# Patient Record
Sex: Female | Born: 1990 | Race: Black or African American | Hispanic: No | Marital: Single | Smoking: Never smoker
Health system: Southern US, Community
[De-identification: ages and names within clinical notes are randomized; demographics above are authoritative.]

## PROBLEM LIST (undated history)

## (undated) ENCOUNTER — Emergency Department (HOSPITAL_COMMUNITY): Admission: EM | Payer: Medicaid Other

## (undated) DIAGNOSIS — Z789 Other specified health status: Secondary | ICD-10-CM

## (undated) HISTORY — DX: Other specified health status: Z78.9

## (undated) HISTORY — PX: NO PAST SURGERIES: SHX2092

---

## 2017-07-27 ENCOUNTER — Encounter (HOSPITAL_COMMUNITY): Payer: Self-pay | Admitting: Emergency Medicine

## 2017-07-27 ENCOUNTER — Ambulatory Visit (HOSPITAL_COMMUNITY)
Admission: EM | Admit: 2017-07-27 | Discharge: 2017-07-27 | Disposition: A | Discharge status: Home / Self Care | Payer: Self-pay | Attending: Family Medicine | Admitting: Family Medicine

## 2017-07-27 DIAGNOSIS — M5489 Other dorsalgia: Secondary | ICD-10-CM

## 2017-07-27 DIAGNOSIS — M7918 Myalgia, other site: Secondary | ICD-10-CM

## 2017-07-27 DIAGNOSIS — M791 Myalgia: Secondary | ICD-10-CM

## 2017-07-27 MED ORDER — METHOCARBAMOL 500 MG PO TABS: 500.0000 mg | ORAL_TABLET | Freq: Four times a day (QID) | ORAL | 0 refills | Status: DC | PRN

## 2017-07-27 MED ORDER — DICLOFENAC SODIUM 75 MG PO TBEC: 75.0000 mg | DELAYED_RELEASE_TABLET | Freq: Two times a day (BID) | ORAL | 0 refills | Status: DC

## 2017-07-27 NOTE — Discharge Instructions (Signed)
You most likely have a strained muscle in your lower back. I have prescribed two medicines for your pain. The first is diclofenac, take 1 tablet twice a day and the other is Robaxin, take 1 tablet two to 4 times a day. Robxin may cause drowsiness so do not drive until you know how this medicine affects you. Also do not drink any alcohol either. You may apply ice and alternate with heat for 15 minutes at a time 4 times daily and for additional pain control you may take tylenol over the counter ever 4 hours but do not take more than 4000 mg a day. Should your pain continue or fail to resolve, follow up with your primary care provider or return to clinic as needed.
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
## 2018-10-31 ENCOUNTER — Emergency Department (HOSPITAL_BASED_OUTPATIENT_CLINIC_OR_DEPARTMENT_OTHER)
Admission: EM | Admit: 2018-10-31 | Discharge: 2018-10-31 | Disposition: A | Discharge status: Home / Self Care | Payer: Medicaid Other | Attending: Emergency Medicine | Admitting: Emergency Medicine

## 2018-10-31 ENCOUNTER — Encounter (HOSPITAL_BASED_OUTPATIENT_CLINIC_OR_DEPARTMENT_OTHER): Payer: Self-pay | Admitting: *Deleted

## 2018-10-31 ENCOUNTER — Other Ambulatory Visit: Payer: Self-pay

## 2018-10-31 DIAGNOSIS — Z79899 Other long term (current) drug therapy: Secondary | ICD-10-CM | POA: Insufficient documentation

## 2018-10-31 DIAGNOSIS — Z3A00 Weeks of gestation of pregnancy not specified: Secondary | ICD-10-CM | POA: Insufficient documentation

## 2018-10-31 DIAGNOSIS — O209 Hemorrhage in early pregnancy, unspecified: Secondary | ICD-10-CM | POA: Insufficient documentation

## 2018-10-31 LAB — COMPREHENSIVE METABOLIC PANEL
ALT: 16 U/L (ref 0–44)
ANION GAP: 5 (ref 5–15)
AST: 23 U/L (ref 15–41)
Albumin: 3.9 g/dL (ref 3.5–5.0)
Alkaline Phosphatase: 38 U/L (ref 38–126)
BUN: 12 mg/dL (ref 6–20)
CHLORIDE: 108 mmol/L (ref 98–111)
CO2: 25 mmol/L (ref 22–32)
Calcium: 8.9 mg/dL (ref 8.9–10.3)
Creatinine, Ser: 0.82 mg/dL (ref 0.44–1.00)
Glucose, Bld: 106 mg/dL — ABNORMAL HIGH (ref 70–99)
POTASSIUM: 3.3 mmol/L — AB (ref 3.5–5.1)
Sodium: 138 mmol/L (ref 135–145)
Total Bilirubin: 1 mg/dL (ref 0.3–1.2)
Total Protein: 6.8 g/dL (ref 6.5–8.1)

## 2018-10-31 LAB — URINALYSIS, MICROSCOPIC (REFLEX): RBC / HPF: >50 RBC/hpf (ref 0–5)

## 2018-10-31 LAB — CBC WITH DIFFERENTIAL/PLATELET
Abs Immature Granulocytes: 0.01 10*3/uL (ref 0.00–0.07)
BASOS PCT: 1 %
Basophils Absolute: 0 10*3/uL (ref 0.0–0.1)
EOS ABS: 0.3 10*3/uL (ref 0.0–0.5)
Eosinophils Relative: 5 %
HCT: 36.7 % (ref 36.0–46.0)
Hemoglobin: 12.1 g/dL (ref 12.0–15.0)
IMMATURE GRANULOCYTES: 0 %
Lymphocytes Relative: 32 %
Lymphs Abs: 2 10*3/uL (ref 0.7–4.0)
MCH: 30.7 pg (ref 26.0–34.0)
MCHC: 33 g/dL (ref 30.0–36.0)
MCV: 93.1 fL (ref 80.0–100.0)
MONOS PCT: 8 %
Monocytes Absolute: 0.5 10*3/uL (ref 0.1–1.0)
Neutro Abs: 3.4 10*3/uL (ref 1.7–7.7)
Neutrophils Relative %: 54 %
PLATELETS: 223 10*3/uL (ref 150–400)
RBC: 3.94 MIL/uL (ref 3.87–5.11)
RDW: 12.9 % (ref 11.5–15.5)
WBC: 6.3 10*3/uL (ref 4.0–10.5)
nRBC: 0 % (ref 0.0–0.2)

## 2018-10-31 LAB — HCG, QUANTITATIVE, PREGNANCY: HCG, BETA CHAIN, QUANT, S: 120 m[IU]/mL — AB (ref <5)

## 2018-10-31 LAB — URINALYSIS, ROUTINE W REFLEX MICROSCOPIC
BILIRUBIN URINE: NEGATIVE
Glucose, UA: NEGATIVE mg/dL
Ketones, ur: NEGATIVE mg/dL
Leukocytes, UA: NEGATIVE
Nitrite: NEGATIVE
PROTEIN: NEGATIVE mg/dL
Specific Gravity, Urine: 1.015 (ref 1.005–1.030)
pH: 7.5 (ref 5.0–8.0)

## 2018-10-31 LAB — RH IG WORKUP (INCLUDES ABO/RH)
ABO/RH(D): O POS
GESTATIONAL AGE(WKS): 5

## 2018-10-31 NOTE — ED Notes (Signed)
ED Provider at bedside. 

## 2018-10-31 NOTE — ED Triage Notes (Signed)
Pt reports she is [redacted] weeks pregnant. Today she has been having vaginal bleeding, spotting this am but now heavier. She is wearing a pad at this time but has not had to change it yet. Pt states this is her first pregnancy

## 2018-10-31 NOTE — Discharge Instructions (Signed)
We will call you regarding your urine and blood type results and may send you a prescription or request you should return.

## 2018-10-31 NOTE — ED Provider Notes (Addendum)
[facility] EMERGENCY DEPARTMENT Provider Note   CSN: [id number] Arrival date & time: 10/31/18  1708     History   Chief Complaint Chief Complaint  Patient presents with  . Vaginal Bleeding    HPI Kayla Potts is a 27 y.o. female.  HPI    Spotting this AM, 9AM-3PM, soaked pantiliner with light blood, no clots or POC Put regular pad around 3, has not had leaking beyond that Does not know blood type LMP was 11/3, had positive pregnancy test at [facility] and at home [redacted]wk pregnant by LMP First pregnancy Had mild cramping, pulling Pain was 5/10, will coming and going No lightheadedness or syncope    History reviewed. No pertinent past medical history.  There are no active problems to display for this patient.   History reviewed. No pertinent surgical history.   OB History    Gravida  1   Para      Term      Preterm      AB      Living        SAB      TAB      Ectopic      Multiple      Live Births               Home Medications    Prior to Admission medications   Medication Sig Start Date End Date Taking? Authorizing Provider  Prenatal Vit-Fe Fumarate-FA (PRENATAL MULTIVITAMIN) TABS tablet Take 1 tablet by mouth daily at 12 noon.   Yes [provider]  methocarbamol (ROBAXIN) 500 MG tablet Take 1 tablet (500 mg total) by mouth every 6 (six) hours as needed for muscle spasms. 07/27/17   Dorena BodoKennard, Lawrence, NP    Family History No family history on file.  Social History Social History   Tobacco Use  . Smoking status: Never Smoker  . Smokeless tobacco: Never Used  Substance Use Topics  . Alcohol use: Not Currently  . Drug use: No     Allergies   Patient has no known allergies.   Review of Systems Review of Systems  Constitutional: Negative for fever.  HENT: Negative for sore throat.   Eyes: Negative for visual disturbance.  Respiratory: Negative for cough and shortness of breath.     Cardiovascular: Negative for chest pain.  Gastrointestinal: Positive for abdominal pain and nausea.  Genitourinary: Positive for vaginal bleeding. Negative for difficulty urinating.  Musculoskeletal: Negative for back pain and neck pain.  Skin: Negative for rash.  Neurological: Negative for syncope and headaches.     Physical Exam Updated Vital Signs BP 129/77 (BP Location: Left Arm)   Pulse 87   Temp 98.5 F (36.9 C) (Oral)   Resp 18   Ht 5' 4.5" (1.638 m)   Wt 77.6 kg   LMP 09/19/2018   SpO2 99%   BMI 28.90 kg/m   Physical Exam Vitals signs and nursing note reviewed.  Constitutional:      General: She is not in acute distress.    Appearance: She is well-developed. She is not diaphoretic.  HENT:     Head: Normocephalic and atraumatic.  Eyes:     Conjunctiva/sclera: Conjunctivae normal.  Neck:     Musculoskeletal: Normal range of motion.  Cardiovascular:     Rate and Rhythm: Normal rate and regular rhythm.  Pulmonary:     Effort: Pulmonary effort is normal. No respiratory distress.  Abdominal:     General: There is  no distension.     Palpations: Abdomen is soft.     Tenderness: There is no abdominal tenderness. There is no guarding.  Musculoskeletal:        General: No tenderness.  Skin:    General: Skin is warm and dry.     Findings: No erythema or rash.  Neurological:     Mental Status: She is alert and oriented to person, place, and time.      ED Treatments / Results  Labs (all labs ordered are listed, but only abnormal results are displayed) Labs Reviewed  COMPREHENSIVE METABOLIC PANEL - Abnormal; Notable for the following components:      Result Value   Potassium 3.3 (*)    Glucose, Bld 106 (*)    All other components within normal limits  HCG, QUANTITATIVE, PREGNANCY - Abnormal; Notable for the following components:   hCG, Beta Chain, Quant, S 120 (*)    All other components within normal limits  URINE CULTURE  CBC WITH DIFFERENTIAL/PLATELET   URINALYSIS, ROUTINE W REFLEX MICROSCOPIC  RH IG WORKUP (INCLUDES ABO/RH)    EKG None  Radiology No results found.  Procedures Procedures (including critical care time)  Medications Ordered in ED Medications - No data to display   Initial Impression / Assessment and Plan / ED Course  I have reviewed the triage vital signs and the nursing notes.  Pertinent labs & imaging results that were available during my care of the patient were reviewed by me and considered in my medical decision making (see chart for details).     27 year old female G1 at 5 weeks by LMP presents with concern for vaginal bleeding and mild lower abdominal cramping.  History and physical exam is not consistent with appendicitis, diverticulitis.  hCG is 120.  She describes overall minimal bleeding. Discussed possibilities of ectopic pregnancy, threatened miscarriage or normal pregnancy with bleeding.  [place] not available today, given low hcg levels doubt ability to see on bedside. Discussed with Dr. Willodean Rosenthal who recommends [facility] [place] tomorrow AM if she is stable.  At this time I have low suspicion for ruptured ectopic pregnancy but have discussed returned precautions with her in detail.  She does not have vaginal discharge, does not describe significant hemorrhage, do not feel emergent pelvic necessary and the patient is desiring discharge.  ABO Rh sent, and urinalysis sent, will follow up on results this evening and notify patient if abnormalities as patient would like immediate discharge and does not show signs of significant hemorrhage or infection.   --Urinalysis without wbc, culture in process, bacteria likely contaminant/mucus.  Pt Rh positive.   Patient to return for [place] tomorrow and follow up with OBGYN at [facility].    Final Clinical Impressions(s) / ED Diagnoses   Final diagnoses:  Vaginal bleeding in pregnancy, first trimester    ED Discharge Orders    None       Alvira Monday,  MD 10/31/18 Kayla Potts    Alvira Monday, MD 11/01/18 [phone]

## 2018-10-31 NOTE — ED Notes (Signed)
Pt given follow up information for outpatient ultrasound. Informed to arrive early with a full bladder. Pt instructed to return to ED or MAU if symptoms worsen throughout the night. Pt voiced understanding. Pt appeared to be in NAD. Pt VSS.

## 2018-11-01 ENCOUNTER — Ambulatory Visit (HOSPITAL_BASED_OUTPATIENT_CLINIC_OR_DEPARTMENT_OTHER)
Admission: RE | Admit: 2018-11-01 | Discharge: 2018-11-01 | Disposition: A | Discharge status: Home / Self Care | Payer: Medicaid Other | Source: Ambulatory Visit | Attending: Emergency Medicine | Admitting: Emergency Medicine

## 2018-11-01 DIAGNOSIS — O209 Hemorrhage in early pregnancy, unspecified: Secondary | ICD-10-CM | POA: Diagnosis not present

## 2018-11-01 DIAGNOSIS — R109 Unspecified abdominal pain: Secondary | ICD-10-CM | POA: Diagnosis present

## 2018-11-01 DIAGNOSIS — O26891 Other specified pregnancy related conditions, first trimester: Secondary | ICD-10-CM | POA: Diagnosis present

## 2018-11-02 ENCOUNTER — Other Ambulatory Visit (INDEPENDENT_AMBULATORY_CARE_PROVIDER_SITE_OTHER): Payer: Self-pay | Admitting: Obstetrics and Gynecology

## 2018-11-02 ENCOUNTER — Encounter: Payer: Self-pay | Admitting: Obstetrics and Gynecology

## 2018-11-02 ENCOUNTER — Telehealth: Payer: Self-pay

## 2018-11-02 ENCOUNTER — Other Ambulatory Visit: Payer: Medicaid Other

## 2018-11-02 VITALS — BP 118/55 | HR 62 | Ht 64.0 in | Wt 179.0 lb

## 2018-11-02 DIAGNOSIS — O3680X0 Pregnancy with inconclusive fetal viability, not applicable or unspecified: Secondary | ICD-10-CM | POA: Insufficient documentation

## 2018-11-02 HISTORY — DX: Pregnancy with inconclusive fetal viability, not applicable or unspecified: O36.80X0

## 2018-11-02 LAB — URINE CULTURE

## 2018-11-02 LAB — HCG, QUANTITATIVE, PREGNANCY: hCG, Beta Chain, Quant, S: 28 m[IU]/mL — ABNORMAL HIGH (ref <5)

## 2018-11-02 NOTE — Progress Notes (Signed)
Patient presents to office for stat bhcg today. Patient reports continued spotting and denies pain. Discussed with patient we are monitoring your bhcg levels today and asked she wait in lobby for results/updated plan of care.   Patient was upset during initial encounter due to a experience at [facility] ED. Patient states (and presented) a different patient's AVS that she was given at the [facility]. Patient states she was given that patient's results and didn't realize that until today when she showed up here. Patient was very upset/frustrated stating she's been stressed out and worried the past few days unnecessarily when it wasn't even her results. Copies were made by front office staff of the AVS without patient information on it and were given back to patient as she had written on it. Patient was upset that the patient information was taken off because she would have no way to prove it in court when asked for evidence. Told patient she didn't need to get a lawyer but should talk to the office of patient experience. Discussed with patient they take patient concerns very seriously and want to rectify the situation in any way they can for her but to also prevent it from happening to another patient. Told patient she will not be asked for proof or evidence. Patient verbalized understanding & was given number to office of patient experience.   Reviewed bhcg results with Dr Vergie LivingPickens, who finds decreasing bhcg levels indicative of SAB. Dr Vergie LivingPickens in to see patient and review results.  Chase Callerarrie H RN BSN 11/02/18

## 2018-11-02 NOTE — Telephone Encounter (Signed)
Maternal fetal medicine  Called (paula) stating that patient arrived at [facility] for her lab work.  Spoke with patient at length yesterday explaining that she should come to our office [place] [facility] for lab follow up today and follow up appointment on Friday with Dr. Erin FullingHarraway Smith. Patient repeated back information.   Contacted Mayra NeerKelly Rassette, [facility] to see if patient could come to her office now for HCG follow up since [facility].  Mayra NeerKelly Rassette coordinated for patient to have [facility] at [facility] today. Armandina StammerJennifer Nadiyah Zeis RN

## 2018-11-05 ENCOUNTER — Ambulatory Visit: Payer: Medicaid Other | Admitting: Obstetrics & Gynecology

## 2018-11-05 NOTE — Addendum Note (Signed)
Addended by: Kinross BingPICKENS, Quinton Voth on: 11/05/2018 03:29 PM   Modules accepted: Level of Service

## 2018-11-05 NOTE — Progress Notes (Signed)
I have reviewed the chart and agree with nursing staff's documentation of this patient's encounter. See my separate note from that day.   Belington Bingharlie Yannis Broce, MD 11/05/2018 10:41 AM

## 2018-11-05 NOTE — Progress Notes (Signed)
Obstetrics and Gynecology New Patient Evaluation  Appointment Date: 11/02/2018  [facility]  Primary Care Provider: System, Provider Not In  Referring Provider: No ref. provider found  Chief Complaint: beta discussion  History of Present Illness: Kayla Potts is a 27 y.o. African-American [id number] (Patient's last menstrual period was 09/19/2018.), seen for the above chief complaint. Her past medical history is significant for nothing.   Patient went to [facility] on 12/15 for VB. She was diagnosed with an early pregnancy with beta 120, Rh pos. No u/s services available there and ED [facility] who recommended outpatient u/s, which was [facility] and it was negative with nothing in uterus or adnexa; no comment on free fluid seen. Patient then had repeat beta hcg set up at clinic here for a lab only visit and the beta today is 28. Patient is upset b/c she was given the wrong discharge information which indicated that there didn't seem to be anything wrong with the pregnancy. Her u/s findings haven't been reviewed with her yet.  I was asked to see the patient today to discuss with her the u/s and beta findings.   No pain, VB, fevers, chills, nausea, vomiting.   Review of Systems: as noted in the History of Present Illness.  Patient Active Problem List   Diagnosis Date Noted  . Pregnancy of unknown anatomic location 11/02/2018     Past Medical History:  No past medical history on file.  Past Surgical History:  No past surgical history on file.  Past Obstetrical History:  OB History  Gravida Para Term Preterm AB Living  1            SAB TAB Ectopic Multiple Live Births               # Outcome Date GA Lbr Len/2nd Weight Sex Delivery Anes PTL Lv  1 Current             Past Gynecological History: As per HPI. Periods: qmonth, regular She is currently using nothing for contraception.   Social History:  Social History    Socioeconomic History  . Marital status: Single    Spouse name: Not on file  . Number of children: Not on file  . Years of education: Not on file  . Highest education level: Not on file  Occupational History  . Not on file  Social Needs  . Financial resource strain: Not on file  . Food insecurity:    Worry: Not on file    Inability: Not on file  . Transportation needs:    Medical: Not on file    Non-medical: Not on file  Tobacco Use  . Smoking status: Never Smoker  . Smokeless tobacco: Never Used  Substance and Sexual Activity  . Alcohol use: Not Currently  . Drug use: No  . Sexual activity: Not on file  Lifestyle  . Physical activity:    Days per week: Not on file    Minutes per session: Not on file  . Stress: Not on file  Relationships  . Social connections:    Talks on phone: Not on file    Gets together: Not on file    Attends religious service: Not on file    Active member of club or organization: Not on file    Attends meetings of clubs or organizations: Not on file    Relationship status: Not on file  . Intimate  partner violence:    Fear of current or ex partner: Not on file    Emotionally abused: Not on file    Physically abused: Not on file    Forced sexual activity: Not on file  Other Topics Concern  . Not on file  Social History Narrative  . Not on file    Family History: No family history on file.  Medications [name] had no medications administered during this visit. Current Outpatient Medications  Medication Sig Dispense Refill  . methocarbamol (ROBAXIN) 500 MG tablet Take 1 tablet (500 mg total) by mouth every 6 (six) hours as needed for muscle spasms. 40 tablet 0  . Prenatal Vit-Fe Fumarate-FA (PRENATAL MULTIVITAMIN) TABS tablet Take 1 tablet by mouth daily at 12 noon.     No current facility-administered medications for this visit.     Allergies Patient has no known allergies.   Physical Exam:  BP (!) 118/55   Pulse 62   Ht  5\' 4"  (1.626 m)   Wt 179 lb (81.2 kg)   LMP 09/19/2018   BMI 30.73 kg/m  Body mass index is 30.73 kg/m. General appearance: Well nourished, well developed female in no acute distress.   Laboratory: as per HPI  Radiology: as per HPI  Assessment: likely SAB  Plan:  1. Pregnancy of unknown anatomic location I sincerely apologized for her care thus far and clinic manager will touch base with hospital officials for follow.  I told her that unfortunately she has a failed pregnancy. I told her that since she has never had a confirmed IUP that I recommend qwk non stat beta hcgs to zero and then should have a period sometime in the next month or two. Patient unsure about birth control and if she wants to try again. I told her that if she is considering trying again, to continue with folic acid.   - hCG, quantitative, pregnancy  Orders Placed This Encounter  Procedures  . hCG, quantitative, pregnancy    RTC 1wk for non stat beta hcg  [name], [name] MD Attending [facility](Faculty Practice)

## 2018-11-08 ENCOUNTER — Other Ambulatory Visit: Payer: Self-pay | Admitting: *Deleted

## 2018-11-08 DIAGNOSIS — O3680X0 Pregnancy with inconclusive fetal viability, not applicable or unspecified: Secondary | ICD-10-CM

## 2018-11-12 ENCOUNTER — Other Ambulatory Visit: Payer: Medicaid Other

## 2018-11-15 ENCOUNTER — Ambulatory Visit: Payer: Medicaid Other | Admitting: Obstetrics & Gynecology

## 2018-11-25 ENCOUNTER — Emergency Department (HOSPITAL_BASED_OUTPATIENT_CLINIC_OR_DEPARTMENT_OTHER)
Admission: EM | Admit: 2018-11-25 | Discharge: 2018-11-25 | Disposition: A | Discharge status: Home / Self Care | Payer: Medicaid Other | Attending: Emergency Medicine | Admitting: Emergency Medicine

## 2018-11-25 ENCOUNTER — Other Ambulatory Visit: Payer: Self-pay

## 2018-11-25 ENCOUNTER — Encounter (HOSPITAL_BASED_OUTPATIENT_CLINIC_OR_DEPARTMENT_OTHER): Payer: Self-pay | Admitting: *Deleted

## 2018-11-25 DIAGNOSIS — J029 Acute pharyngitis, unspecified: Secondary | ICD-10-CM | POA: Diagnosis present

## 2018-11-25 DIAGNOSIS — J111 Influenza due to unidentified influenza virus with other respiratory manifestations: Secondary | ICD-10-CM | POA: Diagnosis not present

## 2018-11-25 DIAGNOSIS — R6889 Other general symptoms and signs: Secondary | ICD-10-CM

## 2018-11-25 LAB — GROUP A STREP BY PCR: GROUP A STREP BY PCR: NOT DETECTED

## 2018-11-25 MED ORDER — GUAIFENESIN 100 MG/5ML PO SYRP: 100.0000 mg | ORAL_SOLUTION | ORAL | 0 refills | Status: DC | PRN

## 2018-11-25 NOTE — ED Notes (Signed)
Tried to get urine sample the pt said she refused.

## 2018-11-25 NOTE — Discharge Instructions (Addendum)
Evaluated today for flulike symptoms.  You are outside the treatment window for Tamiflu.  I did recommend Tylenol, ibuprofen for your symptoms.  I have also written you for a cough syrup.  Follow-up with PCP for reevaluation if you continue to have symptoms.  Return to the ED for any worsening symptoms.

## 2018-11-25 NOTE — ED Triage Notes (Signed)
Pt has had flu like symptoms since Monday, just not feeling herself.

## 2018-11-25 NOTE — ED Provider Notes (Signed)
[facility] EMERGENCY DEPARTMENT Provider Note   CSN: [id number] Arrival date & time: 11/25/18  [id number]  History   Chief Complaint Chief [id number]  Patient presents with  . Nausea  . Sore Throat  . Cough    HPI Kayla Potts is a 28 y.o. female with no significant past medical history who presents for evaluation of multiple complaints.  Patient states she has had sore throat, cough, body aches and pains and has felt warm over the last 4 days.  Did not receive influenza vaccine.  Patient states she has had one episode of nausea this morning without emesis.  Patient able to tolerate p.o. intake without difficulty.  Has not taken anything for her symptoms.  States she has all over body aches and pains which she rates a 4/10.  Her pain does not radiate.  She has no focal pain.  Denies chills, emesis, neck pain, neck stiffness, chest pain, shortness of breath, abdominal pain, dysuria, diarrhea, constipation.  Patient states she has also had nasal congestion and clear rhinorrhea x3 days.  Of note, patient did have a positive pregnancy test on 10/31/2018.  Patient was having vaginal bleeding at that time and was sent to [facility] for an ultrasound.  Patient states she was told at that time she was possibly having a miscarriage.  Patient did not follow-up with OB/GYN after [facility].   History provided by patient.  No interpreter was used.  HPI  History reviewed. No pertinent past medical history.  Patient Active Problem List   Diagnosis Date Noted  . Pregnancy of unknown anatomic location 11/02/2018    History reviewed. No pertinent surgical history.   OB History    Gravida  1   Para      Term      Preterm      AB      Living        SAB      TAB      Ectopic      Multiple      Live Births               Home Medications    Prior to Admission medications   Medication Sig Start Date End Date Taking? Authorizing Provider    guaifenesin (ROBITUSSIN) 100 MG/5ML syrup Take 5-10 mLs (100-200 mg total) by mouth every 4 (four) hours as needed for cough. 11/25/18   Shakeitha Umbaugh A, PA-C    Family History History reviewed. No pertinent family history.  Social History Social History   Tobacco Use  . Smoking status: Never Smoker  . Smokeless tobacco: Never Used  Substance Use Topics  . Alcohol use: Not Currently  . Drug use: No     Allergies   Patient has no known allergies.   Review of Systems Review of Systems  Constitutional: Negative for activity change, appetite change, chills and diaphoresis.       Subjective fever  HENT: Positive for congestion, postnasal drip, rhinorrhea, sinus pressure and sore throat. Negative for ear discharge, ear pain, facial swelling, mouth sores, nosebleeds, sinus pain, sneezing, tinnitus, trouble swallowing and voice change.   Eyes: Negative.   Respiratory: Positive for cough. Negative for choking, chest tightness, shortness of breath, wheezing and stridor.   Cardiovascular: Negative.   Gastrointestinal: Positive for nausea. Negative for abdominal distention, abdominal pain, blood in stool, constipation, diarrhea, rectal pain and vomiting.  Genitourinary: Negative.   Musculoskeletal: Negative.   Skin: Negative.  Neurological: Negative.      Physical Exam Updated Vital Signs BP 121/72 (BP Location: Left Arm)   Pulse 88   Temp 99.1 F (37.3 C) (Oral)   Resp 18   Ht 5' 4.5" (1.638 m)   Wt 77.6 kg   LMP 10/31/2018 (Exact Date)   SpO2 100%   BMI 28.90 kg/m   Physical Exam Vitals signs and nursing note reviewed.  Constitutional:      General: She is not in acute distress.    Appearance: She is well-developed. She is not ill-appearing, toxic-appearing or diaphoretic.  HENT:     Head: Normocephalic and atraumatic.     Right Ear: Tympanic membrane and ear canal normal. No drainage, swelling or tenderness. No middle ear effusion. Tympanic membrane is not  erythematous.     Left Ear: Tympanic membrane and ear canal normal. No drainage, swelling or tenderness.  No middle ear effusion. Tympanic membrane is not erythematous.     Nose:     Comments: Clear Rhinorrhea to bilateral nares.    Mouth/Throat:     Mouth: Mucous membranes are moist.     Tonsils: No tonsillar exudate or tonsillar abscesses. Swelling: 0 on the right. 0 on the left.     Comments: Posterior oropharynx with erythema without exudate.  Tonsils without edema or exudate.  Uvula midline without deviation.  No drooling or dysphasia.  Able to tolerate oral secretions without difficulty.  No evidence of RPA, PTA.  Mucous membranes moist. Eyes:     Conjunctiva/sclera: Conjunctivae normal.     Pupils: Pupils are equal, round, and reactive to light.  Neck:     Musculoskeletal: Normal range of motion.     Comments: No stiffness or rigidity.  Phonation normal.  No cervical lymphadenopathy. Cardiovascular:     Rate and Rhythm: Normal rate.     Pulses: Normal pulses.     Heart sounds: Normal heart sounds.  Pulmonary:     Effort: Pulmonary effort is normal. No respiratory distress.     Breath sounds: Normal breath sounds and air entry.     Comments: Clear to auscultation bilaterally without wheeze, rhonchi or rales.  No accessory muscle usage. Abdominal:     General: Bowel sounds are normal. There is no distension.     Palpations: Abdomen is soft.     Tenderness: There is no abdominal tenderness.     Comments: Soft, nontender without rebound or guarding.  Musculoskeletal: Normal range of motion.     Comments: Moves all extremities without difficulty.  Patient ambulatory in department.  Skin:    General: Skin is warm and dry.     Comments: No rashes or lesions.  Neurological:     Mental Status: She is alert.      ED Treatments / Results  Labs (all labs ordered are listed, but only abnormal results are displayed) Labs Reviewed  GROUP A STREP BY PCR  PREGNANCY, URINE     EKG None  Radiology No results found.  Procedures Procedures (including critical care time)  Medications Ordered in ED Medications - No data to display   Initial Impression / Assessment and Plan / ED Course  I have reviewed the triage vital signs and the nursing notes.  Pertinent labs & imaging results that were available during my care of the patient were reviewed by me and considered in my medical decision making (see chart for details).  28 year old female who appears otherwise well presents for evaluation of multiple complaints.  Afebrile,  nonseptic, non-ill-appearing.  Patient states she has had rhinorrhea, congestion, sore throat, nonproductive cough as well as body aches and pains and a subjective fever over the last 4 days.  Patient has had multiple sick contacts.  Did not receive influenza vaccine.  Patient requesting strep and influenza screen at this time.  Posterior oropharynx with mild erythema without exudate.  Tonsils without edema or exudate.  Able to speak in full sentences without difficulty.  No drooling, dysphasia or trismus.  No evidence of RPA, PTA on exam.  Able to tolerate p.o. intake in department without difficulty.  Lungs clear to auscultation bilaterally without wheeze, rhonchi or rales.  Discussed with patient that she is outside treatment window for Tamiflu as her symptoms have been present x4 days.  Of note, previous medical records show patient did have a positive pregnancy test approximately 3 weeks ago.  Patient was referred to [facility] for evaluation for an ultrasound at that time.  Patient states they told her she could possibly be having a miscarriage and she needed to follow-up with an OB/GYN.  Patient has not followed up.  She denies any current vaginal bleeding or abdominal pain.  Patient refusing pregnancy test in department.  She appears otherwise well and is hemodynamically stable.  Will obtain strep screen reevaluate.   Screen  negative.  Patient likely with influenza, however outside treatment window with Tamiflu.  Presentation non-concerning for PTA or RPA.  Patient able to tolerate p.o. intake in department thought difficulty.  I discussed with patient that she does need to follow-up with OB/GYN given her positive pregnancy test 3 weeks ago, with negative ultrasound findings at that time.  She refused repeat hCG during her visit today.  Encouraged follow-up.  She is hemodynamically stable and appropriate for DC home at this time. Low suspicion for emergent pathology causing symptoms at this time. Discussed return precautions.  Patient voiced understanding and is agreeable for follow-up.    Final Clinical Impressions(s) / ED Diagnoses   Final diagnoses:  Flu-like symptoms    ED Discharge Orders         Ordered    guaifenesin (ROBITUSSIN) 100 MG/5ML syrup  Every 4 hours PRN     11/25/18 1106           Sebastiana Wuest A, PA-C 11/25/18 1117    Jacalyn Lefevre, MD 11/25/18 1118

## 2018-11-25 NOTE — ED Notes (Signed)
Pt had refused pregnancy testing.  Explained to patient that she needs to follow up with OB/GYN for further evaluation.  Explained to patient on diet/fluid suggestions and reviewed OTC medications that she had with her.  Pt verbalized understanding.

## 2018-11-25 NOTE — ED Notes (Signed)
Pt has refused pregnancy test.  Sts she does not want to add to her bill and she does not believe she is pregnant anyway.

## 2019-12-21 IMAGING — US US OB < 14 WEEKS - US OB TV
1 series · 14 of 28 positions shown · non-contrast
Comparison: None.

CLINICAL DATA: Vaginal bleeding. Estimated gestational age of 5
weeks, 6 days by LMP. Quantitative beta HCG is 120.

EXAM:
OBSTETRIC <14 WK US AND TRANSVAGINAL OB US
TECHNIQUE: Both transabdominal and transvaginal ultrasound examinations were
performed for complete evaluation of the gestation as well as the
maternal uterus, adnexal regions, and pelvic cul-de-sac.
Transvaginal technique was performed to assess early pregnancy.

[Series 2: us ob < 14 weeks - us ob tv · 0.24mm/px · 14 of 40 slices shown]
[im 2/40]
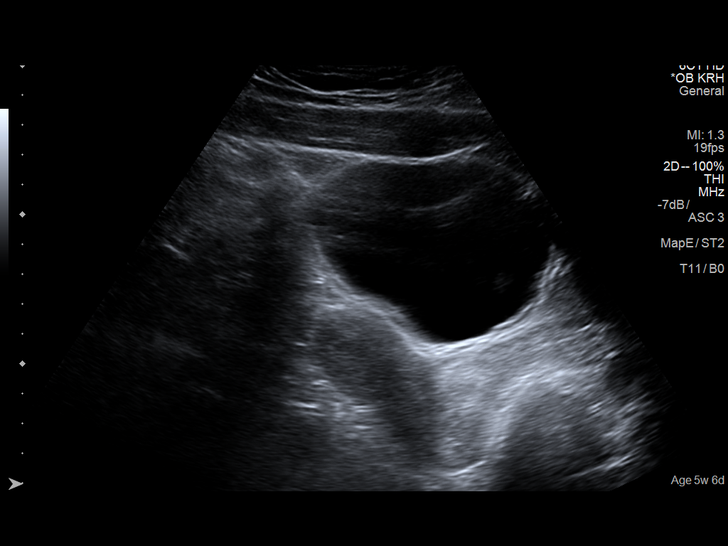
[im 5/40]
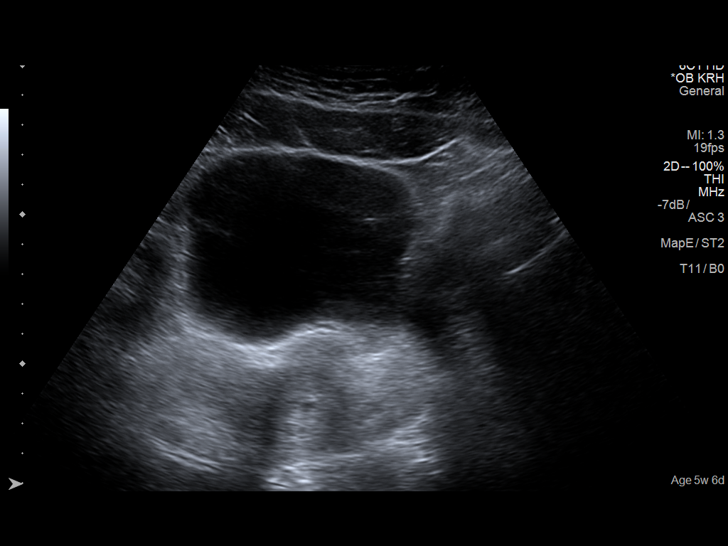
[im 8/40]
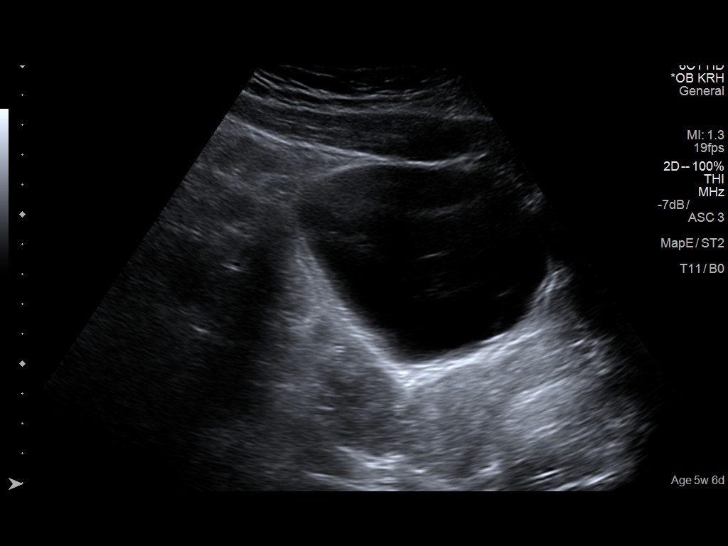
[im 11/40]
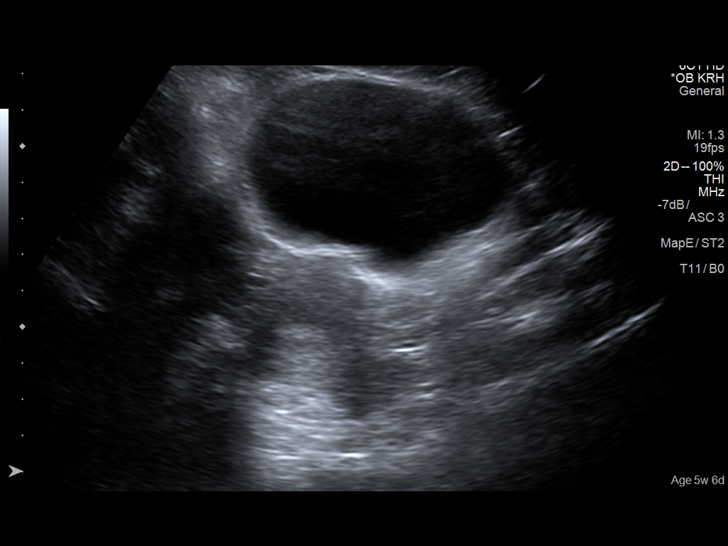
[im 14/40]
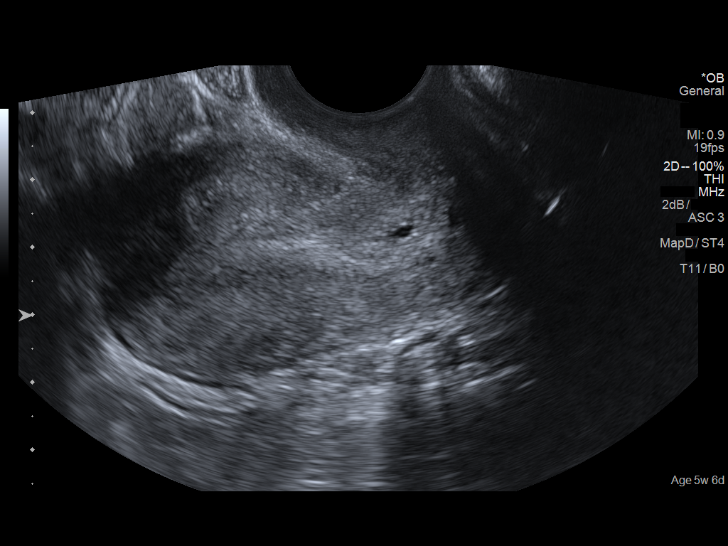
[im 16/40]
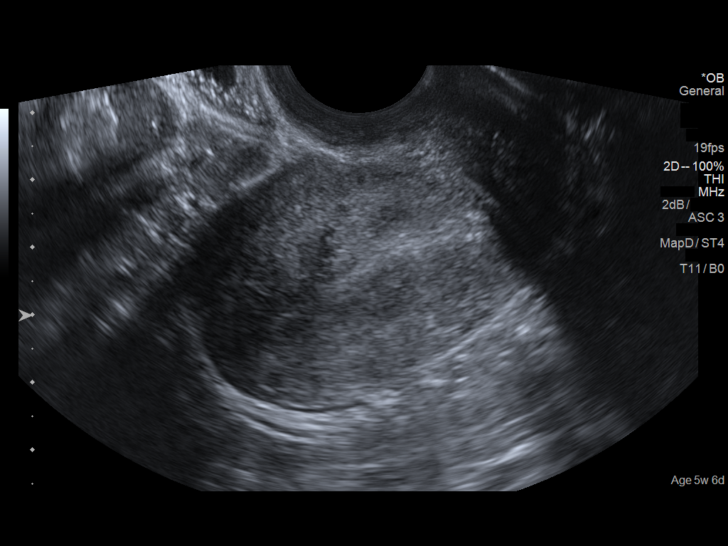
[im 19/40]
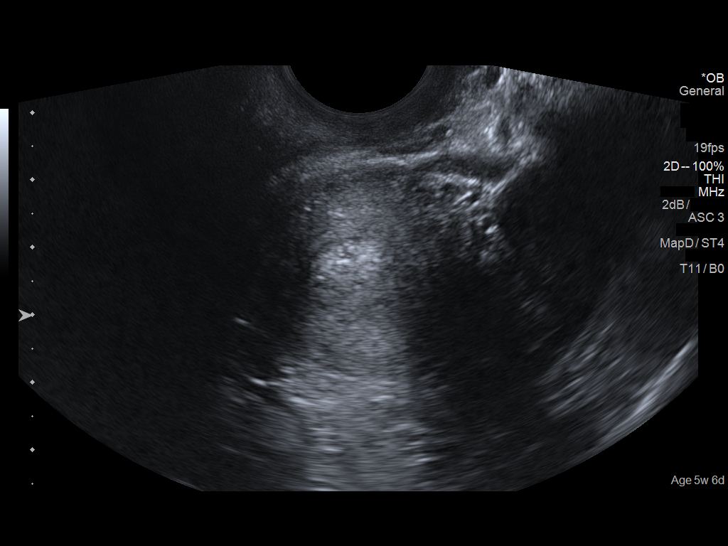
[im 22/40]
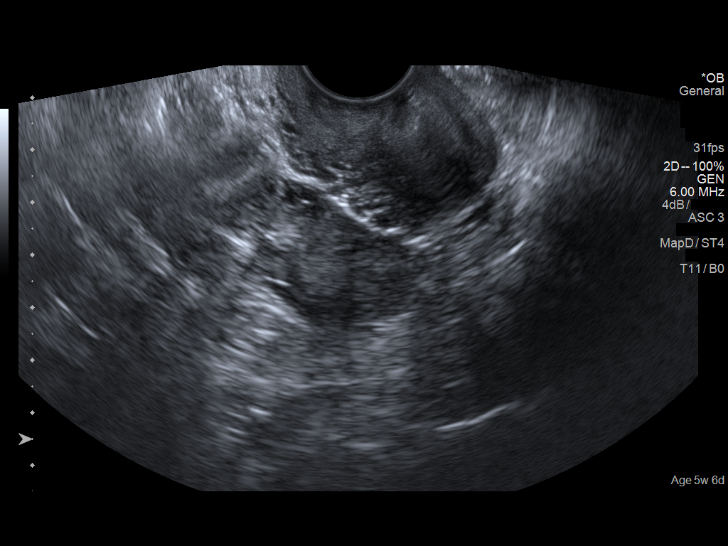
[im 25/40]
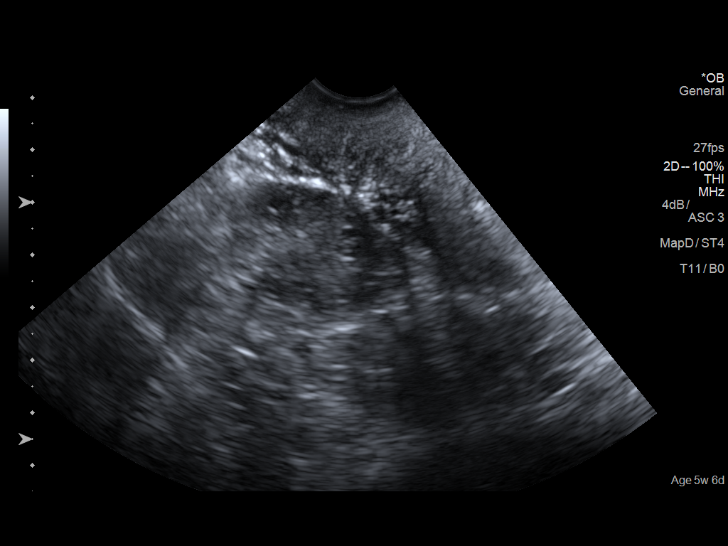
[im 28/40]
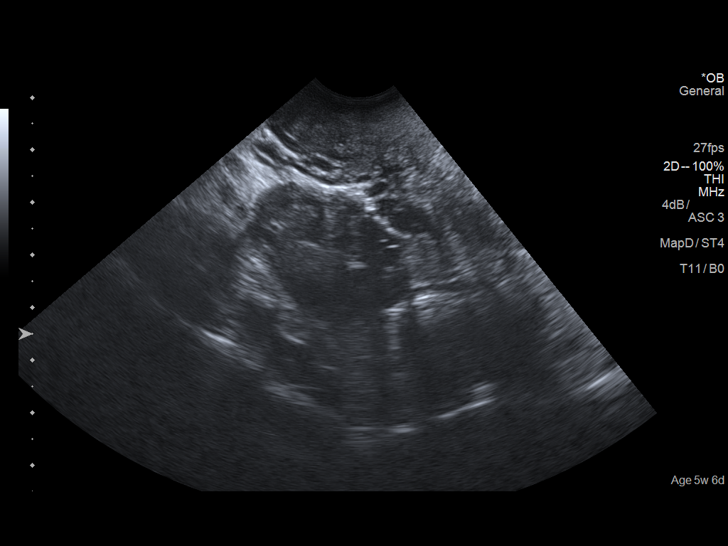
[im 31/40]
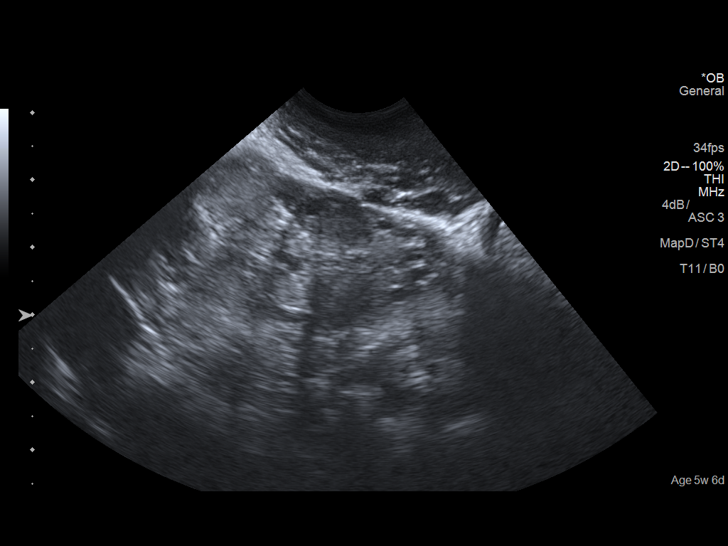
[im 34/40]
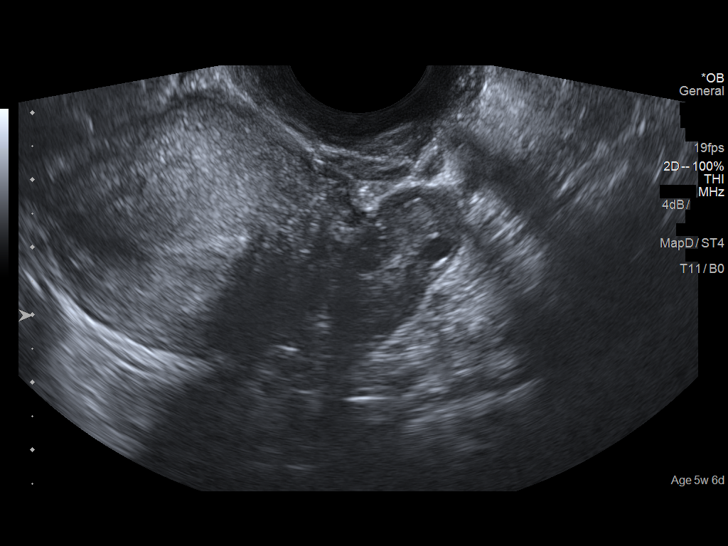
[im 37/40]
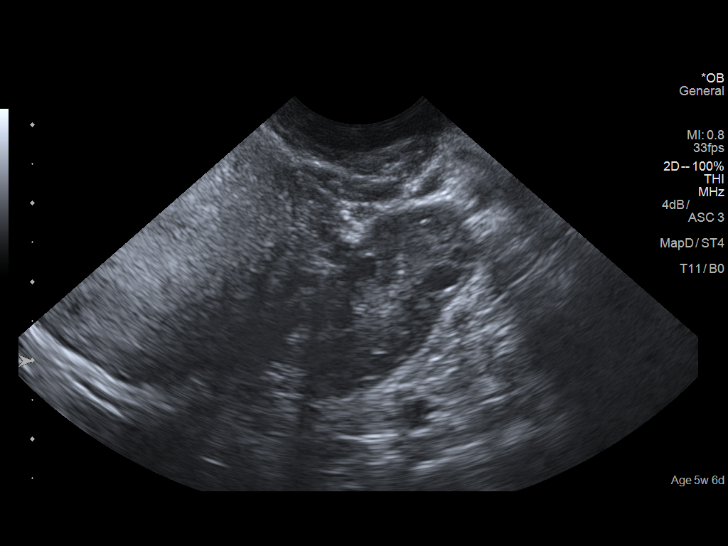
[im 40/40]
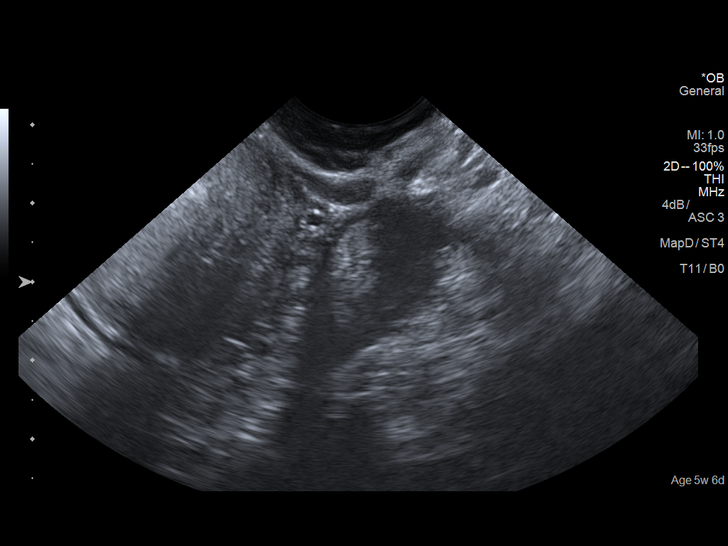

[14 of 28 positions shown; findings below may reference images not displayed]

FINDINGS: Intrauterine gestational sac: None

Yolk sac:  Not Visualized.

Embryo:  Not Visualized.

Maternal uterus/adnexae: Unremarkable.
IMPRESSION: 1.  No IUP is visualized.

By definition, in the setting of a positive pregnancy test, this
reflects a pregnancy of unknown location. Differential
considerations include early normal IUP, abnormal IUP/missed
abortion, or nonvisualized ectopic pregnancy.

Serial beta HCG is suggested. Consider repeat pelvic ultrasound in
14 days.

## 2020-11-17 NOTE — L&D Delivery Note (Signed)
OB/GYN Faculty Practice Delivery Note  Kayla Potts is a 30 y.o. G2P0010 s/p SVD at [redacted]w[redacted]d. She was admitted for IOL for preeclampsia, on magnesium titration.   ROM: 20h 70m with clear fluid GBS Status: positive, treated with penicillin Maximum Maternal Temperature: 99.3  Labor Progress: IOL started with FB and cytotec, then pitocin. She stalled at 8cm overnight. Progressed to complete with position changes and began having rectal pressure.  Delivery Date/Time: 07/13/21 at 1601 Delivery: Called to room and patient was complete and pushing. Head delivered ROA. Nuchal cord present. Shoulder and body delivered in usual fashion. Nuchal cord reduced via somersault maneuver. Infant with spontaneous cry, placed on mother's abdomen, dried and stimulated. Cord clamping delayed for and then cut by FOB. Cord blood drawn. Placenta delivered spontaneously, intact, with 3-vessel cord. Fundus firm with massage and Pitocin, but lower uterine segment not firm and brisk bleeding noted from uterus. Given 800mg  cytotec, extensive fundal rubs, lower uterine segment sweep, then hemabate given and bleeding slowed to a trickle. RN comfortable with monitoring for additional bleeding. Labia, perineum, vagina, and cervix inspected, first degree non-hemostatic laceration found and repaired with 3.0 monocryl.   Placenta: intact, spontaneous, home with parents Complications: PPH Lacerations: first degree laceration EBL: 1193 Analgesia: nitrous oxide, fentanyl for uterine sweep  Postpartum Planning [x]  transfer orders to MB [x]  discharge summary started & shared [x]  message to sent to schedule follow-up  [x]  lists updated [x]  vaccines UTD  Infant: Girl )  APGARs 9/9  3260g  , CNM, IBCLC Certified Nurse Midwife, [facility] for , [facility] 07/13/2021, 8:08 PM

## 2021-01-10 LAB — OB RESULTS CONSOLE VARICELLA ZOSTER ANTIBODY, IGG: Varicella: IMMUNE

## 2021-01-10 LAB — OB RESULTS CONSOLE HIV ANTIBODY (ROUTINE TESTING): HIV: NONREACTIVE

## 2021-01-10 LAB — OB RESULTS CONSOLE HEPATITIS B SURFACE ANTIGEN: Hepatitis B Surface Ag: NEGATIVE

## 2021-01-10 LAB — OB RESULTS CONSOLE HGB/HCT, BLOOD
HCT: 36 (ref 29–41)
Hemoglobin: 12.4

## 2021-01-10 LAB — OB RESULTS CONSOLE GC/CHLAMYDIA
Chlamydia: NEGATIVE
Gonorrhea: NEGATIVE

## 2021-01-10 LAB — OB RESULTS CONSOLE RPR: RPR: NONREACTIVE

## 2021-01-10 LAB — OB RESULTS CONSOLE ABO/RH: RH Type: POSITIVE

## 2021-01-10 LAB — OB RESULTS CONSOLE ANTIBODY SCREEN: Antibody Screen: NEGATIVE

## 2021-01-10 LAB — SICKLE CELL SCREEN: Sickle Cell Screen: NEGATIVE

## 2021-01-10 LAB — OB RESULTS CONSOLE RUBELLA ANTIBODY, IGM: Rubella: IMMUNE

## 2021-01-10 LAB — OB RESULTS CONSOLE PLATELET COUNT: Platelets: 237

## 2021-01-18 DIAGNOSIS — R87810 Cervical high risk human papillomavirus (HPV) DNA test positive: Secondary | ICD-10-CM | POA: Insufficient documentation

## 2021-01-21 DIAGNOSIS — Z3490 Encounter for supervision of normal pregnancy, unspecified, unspecified trimester: Secondary | ICD-10-CM

## 2021-03-07 DIAGNOSIS — Z3689 Encounter for other specified antenatal screening: Secondary | ICD-10-CM

## 2021-05-02 LAB — POCT HEMOGLOBIN: Hemoglobin: 11.2 g/dL (ref 11–14.6)

## 2021-05-02 LAB — OB RESULTS CONSOLE HIV ANTIBODY (ROUTINE TESTING): HIV: NONREACTIVE

## 2021-05-02 LAB — GLUCOSE, 1 HOUR GESTATIONAL: Glucose 1 Hr Prenatal, POC: 100 mg/dL

## 2021-05-02 LAB — OB RESULTS CONSOLE RPR: RPR: NONREACTIVE

## 2021-05-06 ENCOUNTER — Encounter: Payer: Self-pay | Admitting: Obstetrics and Gynecology

## 2021-05-06 DIAGNOSIS — Z3400 Encounter for supervision of normal first pregnancy, unspecified trimester: Secondary | ICD-10-CM | POA: Insufficient documentation

## 2021-05-08 ENCOUNTER — Ambulatory Visit (INDEPENDENT_AMBULATORY_CARE_PROVIDER_SITE_OTHER): Payer: Medicaid Other | Admitting: Obstetrics and Gynecology

## 2021-05-08 ENCOUNTER — Other Ambulatory Visit: Payer: Self-pay

## 2021-05-08 ENCOUNTER — Encounter: Payer: Self-pay | Admitting: Obstetrics and Gynecology

## 2021-05-08 VITALS — BP 124/77 | HR 82 | Temp 98.1°F | Wt 183.0 lb

## 2021-05-08 DIAGNOSIS — O219 Vomiting of pregnancy, unspecified: Secondary | ICD-10-CM | POA: Diagnosis not present

## 2021-05-08 DIAGNOSIS — Z3400 Encounter for supervision of normal first pregnancy, unspecified trimester: Secondary | ICD-10-CM | POA: Diagnosis not present

## 2021-05-08 DIAGNOSIS — Z3A28 28 weeks gestation of pregnancy: Secondary | ICD-10-CM | POA: Diagnosis not present

## 2021-05-08 MED ORDER — BLOOD PRESSURE MONITOR AUTOMAT DEVI: 1.0000 | Freq: Every day | 0 refills | Status: DC

## 2021-05-08 MED ORDER — ONDANSETRON 8 MG PO TBDP: 8.0000 mg | ORAL_TABLET | Freq: Three times a day (TID) | ORAL | 1 refills | Status: DC | PRN

## 2021-05-08 MED ORDER — GOJJI WEIGHT SCALE MISC: 1.0000 | Freq: Every day | 0 refills | Status: DC | PRN

## 2021-05-08 NOTE — Progress Notes (Signed)
INITIAL OBSTETRICAL VISIT Patient name: Kayla Potts MRN [id number]  Date of birth: 12-08-1990 Chief Complaint:   Initial Prenatal Visit (NOB Transfer)  History of Present Illness:   Kayla Potts is a 30 y.o. G69P0010 African American female at [redacted]w[redacted]d by LMP with an Estimated Date of Delivery: 07/26/21 being seen today for her initial obstetrical visit.  Her obstetrical history is significant for  currently hyperemesis gravidarum and h/o SAB in 2019 . This is a planned pregnancy. She and the father of the baby (FOB) "Joetta Manners" live together. She has a support system that consists of boyfriend/family/friends. Today she reports no complaints.   Patient's last menstrual period was 10/19/2020 (exact date). Last pap 01/16/2021. Results were: normal Review of Systems:   Pertinent items are noted in HPI Denies cramping/contractions, leakage of fluid, vaginal bleeding, abnormal vaginal discharge w/ itching/odor/irritation, headaches, visual changes, shortness of breath, chest pain, abdominal pain, severe nausea/vomiting, or problems with urination or bowel movements unless otherwise stated above.  Pertinent History Reviewed:  Reviewed past medical,surgical, social, obstetrical and family history.  Reviewed problem list, medications and allergies. OB History  Gravida Para Term Preterm AB Living  2       1    SAB IAB Ectopic Multiple Live Births  1            # Outcome Date GA Lbr Len/2nd Weight Sex Delivery Anes PTL Lv  2 Current           1 SAB 2019           Physical Assessment:   Vitals:   05/08/21 0909  BP: 124/77  Pulse: 82  Temp: 98.1 F (36.7 C)  Weight: 183 lb (83 kg)  Body mass index is 30.93 kg/m.       Physical Examination:  General appearance - well appearing, and in no distress  Mental status - alert, oriented to person, place, and time  Psych:  She has a normal mood and affect  Skin - warm and dry, normal color, no suspicious lesions noted  Chest - effort normal, all  lung fields clear to auscultation bilaterally  Heart - normal rate and regular rhythm  Abdomen - soft, nontender  Extremities:  No swelling or varicosities noted  Pelvic - VULVA: normal appearing vulva with no masses, tenderness or lesions  VAGINA: normal appearing vagina with normal color and discharge, no lesions.   CERVIX: normal appearing cervix without discharge or lesions, no CMT  Thin prep pap is not done   FHTs by doppler: 145 bpm  Assessment & Plan:  1) Low-Risk Pregnancy G2P0010 at [redacted]w[redacted]d with an Estimated Date of Delivery: 07/26/21   2) Initial OB visit - Welcomed to practice and introduced self to patient in addition to discussing other advanced practice providers that she may be seeing at this practice - Congratulated patient - Anticipatory guidance on upcoming appointments - Educated on COVID19 and pregnancy and the integration of virtual appointments  - Educated on babyscripts app- patient reports she has not received email, encouraged to look in spam folder and to call office if she still has not received email - patient verbalizes understanding    3) Supervision of normal first pregnancy, antepartum - Rx for ondansetron (ZOFRAN-ODT) 8 MG disintegrating tablet; Take 1 tablet (8 mg total) by mouth every 8 (eight) hours as needed for nausea or vomiting.  Dispense: 30 tablet; Refill: 1 - Blood Pressure Monitoring (BLOOD PRESSURE MONITOR AUTOMAT) DEVI; 1 Device by Does not apply route  daily. Automatic blood pressure cuff regular size. To monitor blood pressure regularly at home. ICD-10 code:Z34.90  Dispense: 1 each; Refill: 0 - Misc. Devices (GOJJI WEIGHT SCALE) MISC; 1 Device by Does not apply route daily as needed. To weight self daily as needed at home. ICD-10 code: Z34.90  Dispense: 1 each; Refill: 0 - Information provided on 3rd trimester pregnancy, FKC, Fe rich diet, and [facility] H2O birth info   4) Nausea and vomiting of pregnancy, antepartum - Rx for ondansetron (ZOFRAN-ODT) 8 MG  disintegrating tablet; Take 1 tablet (8 mg total) by mouth every 8 (eight) hours as needed for nausea or vomiting.  Dispense: 30 tablet; Refill: 1    Meds:  Meds ordered this encounter  Medications   ondansetron (ZOFRAN-ODT) 8 MG disintegrating tablet    Sig: Take 1 tablet (8 mg total) by mouth every 8 (eight) hours as needed for nausea or vomiting.    Dispense:  30 tablet    Refill:  1   Blood Pressure Monitoring (BLOOD PRESSURE MONITOR AUTOMAT) DEVI    Sig: 1 Device by Does not apply route daily. Automatic blood pressure cuff regular size. To monitor blood pressure regularly at home. ICD-10 code:Z34.90    Dispense:  1 each    Refill:  0   Misc. Devices (GOJJI WEIGHT SCALE) MISC    Sig: 1 Device by Does not apply route daily as needed. To weight self daily as needed at home. ICD-10 code: Z34.90    Dispense:  1 each    Refill:  0    Initial labs obtained Continue prenatal vitamins Reviewed n/v relief measures and warning s/s to report Reviewed recommended weight gain based on pre-gravid BMI Encouraged well-balanced diet Genetic Screening discussed: ordered Cystic fibrosis, SMA, Fragile X screening discussed ordered The nature of [place] [facility] with multiple MDs and other Advanced Practice Providers was explained to patient; also emphasized that residents, students are part of our team.  Discussed optimized OB schedule and video visits. Advised can have an in-office visit whenever she feels she needs to be seen.  Does not have own BP cuff. BP cuff Rx faxed today. Explained to patient that BP will be mailed to her house. Check BP weekly, let us know if >140/90. Advised to call during normal business hours and there is an after-hours nurse line available.    Follow-up: No follow-ups on file.   Orders Placed This Encounter  Procedures   OB RESULTS CONSOLE GC/Chlamydia   OB RESULTS CONSOLE RPR   OB RESULTS CONSOLE HIV antibody   OB RESULTS CONSOLE  Rubella Antibody   OB RESULTS CONSOLE Varicella zoster antibody, IgG   OB RESULTS CONSOLE Hepatitis B surface antigen   OB RESULTS CONSOLE Hemoglobin and hematocrit, blood   OB RESULTS CONSOLE PLATELET COUNT   Sickle cell screen   OB RESULTS CONSOLE RPR   OB RESULTS CONSOLE HIV antibody   Glucose, 1 hour gestational   POCT hemoglobin   OB RESULTS CONSOLE ABO/Rh   OB RESULTS CONSOLE Antibody Screen    Raelyn Mora MSN, CNM 05/08/2021

## 2021-05-08 NOTE — Patient Instructions (Addendum)
?  Considering Waterbirth? ?Guide for patients at [facility] ([facility]) ?Why consider waterbirth? ?Gentle birth for babies  ?Less pain medicine used in labor  ?May allow for passive descent/less pushing  ?May reduce perineal tears  ?More mobility and instinctive maternal position changes  ?Increased maternal relaxation  ? ?Is waterbirth safe? What are the risks of infection, drowning or other complications? ?Infection:  ?Very low risk (3.7 % for tub vs 4.8% for bed)  ?7 in 8000 waterbirths with documented infection  ?Poorly cleaned equipment most common cause  ?Slightly lower group B strep transmission rate  ?Drowning  ?Maternal:  ?Very low risk  ?Related to seizures or fainting  ?Newborn:  ?Very low risk. No evidence of increased risk of respiratory problems in multiple large studies  ?Physiological protection from breathing under water  ?Avoid underwater birth if there are any fetal complications  ?Once baby's head is out of the water, keep it out.  ?Birth complication  ?Some reports of cord trauma, but risk decreased by bringing baby to surface gradually  ?No evidence of increased risk of shoulder dystocia. Mothers can usually change positions faster in water than in a bed, possibly aiding the maneuvers to free the shoulder.  ? ?There are 2 things you MUST do to have a waterbirth with [facility]: ?Attend a waterbirth class at [facility] [place]   ?3rd Wednesday of every month from 7-9 pm (virtual during COVID) ?Free ?Register online at [email or web address] or [email or web address] or by calling [phone] ?Bring us the certificate from the class to your prenatal appointment or send via MyChart ?Meet with a midwife at 36 weeks* to see if you can still plan a waterbirth and to sign the consent.  ? ?*We also recommend that you schedule as many of your prenatal visits with a midwife as possible.   ? ?Helpful information: ?You may want to bring a bathing suit top to the hospital  to wear during labor but this is optional.  All other supplies are provided by the hospital. ?Please arrive at the hospital with signs of active labor, and do not wait at home until late in labor. It takes 45 min- 2 hours for COVID testing, fetal monitoring, and check in to your room to take place, plus transport and filling of the waterbirth tub.   ? ?Things that would prevent you from having a waterbirth: ?Unknown or Positive COVID-19 diagnosis upon admission to hospital* ?Premature, <37wks  ?Previous cesarean birth  ?Presence of thick meconium-stained fluid  ?Multiple gestation (Twins, triplets, etc.)  ?Uncontrolled diabetes or gestational diabetes requiring medication  ?Hypertension diagnosed in pregnancy or preexisting hypertension (gestational hypertension, preeclampsia, or chronic hypertension) ?Fetal growth restriction (your baby measures less than 10th percentile on ultrasound) ?Heavy vaginal bleeding  ?Non-reassuring fetal heart rate  ?Active infection (MRSA, etc.). Group B Strep is NOT a contraindication for waterbirth.  ?If your labor has to be induced and induction method requires continuous monitoring of the baby's heart rate  ?Other risks/issues identified by your obstetrical provider  ? ?Please remember that birth is unpredictable. Under certain unforeseeable circumstances your provider may advise against giving birth in the tub. These decisions will be made on a case-by-case basis and with the safety of you and your baby as our highest priority. ? ? ?*Please remember that in order to have a waterbirth, you must test Negative to COVID-19 upon admission to the hospital. ? ?Updated 02/25/21 ? ?

## 2021-06-05 ENCOUNTER — Other Ambulatory Visit: Payer: Self-pay

## 2021-06-05 ENCOUNTER — Telehealth (INDEPENDENT_AMBULATORY_CARE_PROVIDER_SITE_OTHER): Payer: Medicaid Other | Admitting: Obstetrics and Gynecology

## 2021-06-05 ENCOUNTER — Encounter: Payer: Self-pay | Admitting: Obstetrics and Gynecology

## 2021-06-05 VITALS — BP 135/79 | HR 85 | Wt 186.0 lb

## 2021-06-05 DIAGNOSIS — Z3400 Encounter for supervision of normal first pregnancy, unspecified trimester: Secondary | ICD-10-CM

## 2021-06-05 DIAGNOSIS — Z3A32 32 weeks gestation of pregnancy: Secondary | ICD-10-CM | POA: Diagnosis not present

## 2021-06-05 DIAGNOSIS — O219 Vomiting of pregnancy, unspecified: Secondary | ICD-10-CM | POA: Diagnosis not present

## 2021-06-05 MED ORDER — ONDANSETRON 8 MG PO TBDP: 8.0000 mg | ORAL_TABLET | Freq: Three times a day (TID) | ORAL | 1 refills | Status: DC | PRN

## 2021-06-05 NOTE — Patient Instructions (Signed)
Group B Streptococcus Test During Pregnancy °Why am I having this test? °Routine testing, also called screening, for group B streptococcus (GBS) is recommended for all pregnant women between the 36th and 37th week of pregnancy. GBS is a type of bacteria that can be passed from mother to baby during childbirth. Screening will help guide whether or not you will need treatment during labor and delivery to prevent complications such as: °An infection in your uterus during labor. °An infection in your uterus after delivery. °A serious infection in your baby after delivery, such as pneumonia, meningitis, or sepsis. °GBS screening is not often done before 36 weeks of pregnancy unless you go into labor prematurely. °What happens if I have group B streptococcus? °If testing shows that you have GBS, your health care provider will recommend treatment with IV antibiotics during labor and delivery. This treatment significantly decreases the risk of complications for you and your baby. °If you have a planned C-section and you have GBS, you may not need to be treated with antibiotics because GBS is usually passed to babies after labor starts and your water breaks. If you are in labor or your water breaks before your C-section, it is possible for GBS to get into your uterus and be passed to your baby, so you might need treatment. °Is there a chance I may not need to be tested? °You may not need to be tested for GBS if: °You have a urine test that shows GBS before 36 to 37 weeks. °You had a baby with GBS infection after a previous delivery. °In these cases, you will automatically be treated for GBS during labor and delivery. °What is being tested? °This test is done to check if you have group B streptococcus in your vagina or rectum. °What kind of sample is taken? °To collect samples for this test, your health care provider will swab your vagina and rectum with a cotton swab. The sample is then sent to the lab to see if GBS is  present. °What happens during the test? ° °You will remove your clothing from the waist down. °You will lie down on an exam table in the same position as you would for a pelvic exam. °Your health care provider will swab your vagina and rectum to collect samples for a culture test. °You will be able to go home after the test and do all your usual activities. °How are the results reported? °The test results are reported as positive or negative. °What do the results mean? °A positive test means you are at risk for passing GBS to your baby during labor and delivery. Your health care provider will recommend that you are treated with an IV antibiotic during labor and delivery. °A negative test means you are at very low risk of passing GBS to your baby. There is still a low risk of passing GBS to your baby because sometimes test results may report that you do not have a condition when you do (false-negative result) or there is a chance that you may become infected with GBS after the test is done. You most likely will not need to be treated with an antibiotic during labor and delivery. °Talk with your health care provider about what your results mean. °Questions to ask your health care provider °Ask your health care provider, or the department that is doing the test: °When will my results be ready? °How will I get my results? °What are my treatment options? °Summary °Routine testing (screening) for   group B streptococcus (GBS) is recommended for all pregnant women between the 36th and 37th week of pregnancy. °GBS is a type of bacteria that can be passed from mother to baby during childbirth. °If testing shows that you have GBS, your health care provider will recommend that you are treated with IV antibiotics during labor and delivery. This treatment almost always prevents infection in newborns. °This information is not intended to replace advice given to you by your health care provider. Make sure you discuss any questions  you have with your health care provider. °Document Revised: 09/04/2020 Document Reviewed: 12/01/2018 °Elsevier Patient Education © 2022 Elsevier Inc. ° °

## 2021-06-05 NOTE — Progress Notes (Signed)
MY CHART VIDEO VIRTUAL OBSTETRICS VISIT ENCOUNTER NOTE  I connected with [name] on 06/05/21 at 10:50 AM EDT by My Chart video at home and verified that I am speaking with the correct person using two identifiers. Provider located at [facility] [employer] for [employer] at [place].   I discussed the limitations, risks, security and privacy concerns of performing an evaluation and management service by My Chart video and the availability of in person appointments. I also discussed with the patient that there may be a patient responsible charge related to this service. The patient expressed understanding and agreed to proceed.  I discussed the limitations of telemedicine and the availability of in person appointments.  Discussed there is a possibility of technology failure and discussed alternative modes of communication if that failure occurs.  Subjective:  Kayla Potts is a 30 y.o. G2P0010 at [redacted]w[redacted]d being followed for ongoing prenatal care.  She is currently monitored for the following issues for this low-risk pregnancy and has Pregnancy of unknown anatomic location and Supervision of normal first pregnancy, antepartum on their problem list.  Patient reports  occ. SOB with movement and swollen ankles. She denies any visual changes or dizziness. She declines any BC and plans to breast and bottle feed . Reports fetal movement. Denies any contractions, bleeding or leaking of fluid.   The following portions of the patient's history were reviewed and updated as appropriate: allergies, current medications, past family history, past medical history, past social history, past surgical history and problem list.   Objective:   General:  Alert, oriented and cooperative.   Mental Status: Normal mood and affect perceived. Normal judgment and thought content.  Rest of physical exam deferred due to type of encounter  BP 135/79 (BP Location: Left Arm, Patient Position: Sitting, Cuff Size: Normal)    Pulse 85   Wt 186 lb (84.4 kg)   LMP 10/19/2020 (Exact Date)   BMI 31.43 kg/m  **Done by patient's own at home BP cuff and scale  Assessment and Plan:  Pregnancy: G2P0010 at [redacted]w[redacted]d  1. Supervision of normal first pregnancy, antepartum - Anticipatory guidance for GBS screening at 36 wks. Explained the test is important to be done at this time in pregnancy to ensure adequate treatment at the time of delivery. Explained that a positive result does not mean any harm to her, but can be harmful to the baby. Meaning that if baby is exposed to the bacteria for too long without antibiotics, the bay has the potential to develop pneumonia, septicemia, or spinal meningitis and could end up in the NICU. Also, explained that a cervical exam may be performed at the time of testing to get a baseline cervical check and make sure there is no preterm cervical dilation.  - MyChart message sent to GBS testing in pregnancy  2. Nausea and vomiting of pregnancy, antepartum - Rx for ondansetron (ZOFRAN-ODT) 8 MG disintegrating tablet; Take 1 tablet (8 mg total) by mouth every 8 (eight) hours as needed for nausea or vomiting.  Dispense: 30 tablet; Refill: 1  Preterm labor symptoms and general obstetric precautions including but not limited to vaginal bleeding, contractions, leaking of fluid and fetal movement were reviewed in detail with the patient.  I discussed the assessment and treatment plan with the patient. The patient was provided an opportunity to ask questions and all were answered. The patient agreed with the plan and demonstrated an understanding of the instructions. The patient was advised to call back or seek an in-person  office evaluation/go to [facility] at [facility] for any urgent or concerning symptoms. Please refer to After Visit Summary for other counseling recommendations.   I provided 10 minutes of non-face-to-face time during this encounter. There was 5 minutes of chart review time spent  prior to this encounter. Total time spent = 15 minutes.  Return in about 2 weeks (around 06/19/2021).  Future Appointments  Date Time Provider Department Center  06/06/2021  1:30 PM [facility] NUR[facility] None  06/19/2021  4:15 PM Calvert Cantor, CNM [facility] None  07/04/2021  2:10 PM Raelyn Mora, CNM [facility] None  07/11/2021  4:10 PM Raelyn Mora, CNM [facility] None  07/18/2021  2:00 PM Raelyn Mora, CNM [facility] None  07/25/2021  2:00 PM Raelyn Mora, CNM [facility] None    Raelyn Mora, CNM Center for [employer], [facility]

## 2021-06-06 ENCOUNTER — Ambulatory Visit (INDEPENDENT_AMBULATORY_CARE_PROVIDER_SITE_OTHER): Payer: Medicaid Other | Admitting: *Deleted

## 2021-06-06 VITALS — BP 127/89 | HR 88 | Temp 98.5°F | Wt 188.6 lb

## 2021-06-06 DIAGNOSIS — Z3400 Encounter for supervision of normal first pregnancy, unspecified trimester: Secondary | ICD-10-CM

## 2021-06-06 DIAGNOSIS — Z3A32 32 weeks gestation of pregnancy: Secondary | ICD-10-CM

## 2021-06-06 DIAGNOSIS — O169 Unspecified maternal hypertension, unspecified trimester: Secondary | ICD-10-CM

## 2021-06-06 NOTE — Progress Notes (Signed)
   Subjective:  Kayla Potts is a 30 y.o. female here for BP check.   Hypertension ROS: no TIA's, no chest pain on exertion, no dyspnea on exertion, no swelling of ankles, no orthostatic dizziness or lightheadedness, and no orthopnea or paroxysmal nocturnal dyspnea.    Objective:  BP 127/89 (BP Location: Left Arm, Patient Position: Sitting, Cuff Size: Large)   Pulse 88   Temp 98.5 F (36.9 C) (Oral)   Wt 188 lb 9.6 oz (85.5 kg)   LMP 10/19/2020 (Exact Date)   Breastfeeding No   BMI 31.87 kg/m   Appearance alert, well appearing, and in no distress, oriented to person, place, and time, and normal appearing weight. General exam BP noted to be well controlled today in office.    Assessment:   Blood Pressure well controlled, stable, and asymptomatic.   Plan:  Orders and follow up as documented in patient record. PEC labs ordered per Raelyn Mora, CNM  Clovis Pu, RN

## 2021-06-07 LAB — CBC
Hematocrit: 35.8 % (ref 34.0–46.6)
Hemoglobin: 12.1 g/dL (ref 11.1–15.9)
MCH: 31.1 pg (ref 26.6–33.0)
MCHC: 33.8 g/dL (ref 31.5–35.7)
MCV: 92 fL (ref 79–97)
Platelets: 139 10*3/uL — ABNORMAL LOW (ref 150–450)
RBC: 3.89 x10E6/uL (ref 3.77–5.28)
RDW: 12.3 % (ref 11.7–15.4)
WBC: 9.5 10*3/uL (ref 3.4–10.8)

## 2021-06-07 LAB — PROTEIN / CREATININE RATIO, URINE
Creatinine, Urine: 80 mg/dL
Protein, Ur: 9.4 mg/dL
Protein/Creat Ratio: 118 mg/g creat (ref 0–200)

## 2021-06-07 LAB — COMPREHENSIVE METABOLIC PANEL
ALT: 10 IU/L (ref 0–32)
AST: 22 IU/L (ref 0–40)
Albumin/Globulin Ratio: 1.9 (ref 1.2–2.2)
Albumin: 4.1 g/dL (ref 3.9–5.0)
Alkaline Phosphatase: 109 IU/L (ref 44–121)
BUN/Creatinine Ratio: 11 (ref 9–23)
BUN: 7 mg/dL (ref 6–20)
Bilirubin Total: 0.5 mg/dL (ref 0.0–1.2)
CO2: 21 mmol/L (ref 20–29)
Calcium: 9.5 mg/dL (ref 8.7–10.2)
Chloride: 101 mmol/L (ref 96–106)
Creatinine, Ser: 0.64 mg/dL (ref 0.57–1.00)
Globulin, Total: 2.2 g/dL (ref 1.5–4.5)
Glucose: 71 mg/dL (ref 65–99)
Potassium: 4.3 mmol/L (ref 3.5–5.2)
Sodium: 137 mmol/L (ref 134–144)
Total Protein: 6.3 g/dL (ref 6.0–8.5)
eGFR: 123 mL/min/{1.73_m2} (ref >59)

## 2021-06-19 ENCOUNTER — Ambulatory Visit (INDEPENDENT_AMBULATORY_CARE_PROVIDER_SITE_OTHER): Payer: Medicaid Other | Admitting: Advanced Practice Midwife

## 2021-06-19 ENCOUNTER — Other Ambulatory Visit: Payer: Self-pay

## 2021-06-19 ENCOUNTER — Encounter: Payer: Self-pay | Admitting: Advanced Practice Midwife

## 2021-06-19 VITALS — BP 133/84 | HR 85 | Temp 98.6°F | Wt 192.6 lb

## 2021-06-19 DIAGNOSIS — R600 Localized edema: Secondary | ICD-10-CM

## 2021-06-19 DIAGNOSIS — Z3400 Encounter for supervision of normal first pregnancy, unspecified trimester: Secondary | ICD-10-CM

## 2021-06-19 DIAGNOSIS — Z3A34 34 weeks gestation of pregnancy: Secondary | ICD-10-CM

## 2021-06-20 NOTE — Progress Notes (Addendum)
   PRENATAL VISIT NOTE  Subjective:  Kayla Potts is a 30 y.o. G2P0010 at [redacted]w[redacted]d being seen today for ongoing prenatal care.  She is currently monitored for the following issues for this low-risk pregnancy and has Supervision of normal first pregnancy, antepartum on their problem list.  Patient reports  bilateral edema in her feet and ankles . Complaint resolves overnight and when she elevates her feet. She has a favorite bench she likes to sit on and thinks this may be a contributing factor.   Contractions: Irregular. Vag. Bleeding: None.  Movement: Present. Denies leaking of fluid.   The following portions of the patient's history were reviewed and updated as appropriate: allergies, current medications, past family history, past medical history, past social history, past surgical history and problem list. Problem list updated.  Objective:   Vitals:   06/19/21 1624  BP: 133/84  Pulse: 85  Temp: 98.6 F (37 C)  Weight: 192 lb 9.6 oz (87.4 kg)    Fetal Status: Fetal Heart Rate (bpm): 154   Movement: Present     General:  Alert, oriented and cooperative. Patient is in no acute distress.  Skin: Skin is warm and dry. No rash noted.   Cardiovascular: Normal heart rate noted  Respiratory: Normal respiratory effort, no problems with respiration noted  Abdomen: Soft, gravid, appropriate for gestational age.  Pain/Pressure: Present     Pelvic: Cervical exam deferred        Extremities: Normal range of motion.  Edema: Mild pitting, slight indentation  Mental Status: Normal mood and affect. Normal behavior. Normal judgment and thought content.   Assessment and Plan:  Pregnancy: G2P0010 at [redacted]w[redacted]d  1. Supervision of normal first pregnancy, antepartum - Needs Hep C collected, not collected by previous provider. Pt declines today - All aspects of birth plan reviewed - Planning waterbirth, forgot class certificate today. Will bring to next appointment and sign consent at that time - Advised  consideration of [facility] credentialed doula,    2. [redacted] weeks gestation of pregnancy   3. Bilateral lower extremity edema  - Encouraged wearing supportive athletic shoes or slide style sandals instead of crocs - Elevate feet when seated to minimize dependent edema   Preterm labor symptoms and general obstetric precautions including but not limited to vaginal bleeding, contractions, leaking of fluid and fetal movement were reviewed in detail with the patient. Please refer to After Visit Summary for other counseling recommendations.  Return in about 2 weeks (around 07/03/2021).  Total visit time: 75 minutes. Greater than 50% of visit spent in counseling and coordination of care  Future Appointments  Date Time Provider Department Center  07/04/2021  2:10 PM Raelyn Mora, CNM [facility] None  07/11/2021  4:10 PM Raelyn Mora, CNM [facility] None  07/18/2021  2:00 PM Raelyn Mora, CNM [facility] None  07/25/2021  2:00 PM Raelyn Mora, CNM [facility] None    Calvert Cantor, [place]

## 2021-07-04 ENCOUNTER — Ambulatory Visit (INDEPENDENT_AMBULATORY_CARE_PROVIDER_SITE_OTHER): Payer: Medicaid Other | Admitting: Obstetrics and Gynecology

## 2021-07-04 ENCOUNTER — Other Ambulatory Visit: Payer: Self-pay

## 2021-07-04 ENCOUNTER — Other Ambulatory Visit (HOSPITAL_COMMUNITY)
Admission: RE | Admit: 2021-07-04 | Discharge: 2021-07-04 | Disposition: A | Discharge status: Home / Self Care | Payer: Medicaid Other | Source: Ambulatory Visit | Attending: Obstetrics and Gynecology | Admitting: Obstetrics and Gynecology

## 2021-07-04 VITALS — BP 128/81 | HR 79 | Temp 98.4°F | Wt 199.6 lb

## 2021-07-04 DIAGNOSIS — Z3400 Encounter for supervision of normal first pregnancy, unspecified trimester: Secondary | ICD-10-CM

## 2021-07-04 DIAGNOSIS — Z3A36 36 weeks gestation of pregnancy: Secondary | ICD-10-CM

## 2021-07-04 NOTE — Progress Notes (Signed)
   LOW-RISK PREGNANCY OFFICE VISIT Patient name: Kayla Potts MRN [id number]  Date of birth: Sep 07, 1991 Chief Complaint:   Routine Prenatal Visit  History of Present Illness:   Kayla Potts is a 30 y.o. G53P0010 female at [redacted]w[redacted]d with an Estimated Date of Delivery: 07/26/21 being seen today for ongoing management of a low-risk pregnancy.  Today she reports occasional contractions. Contractions: Irregular. Vag. Bleeding: None.  Movement: Present. denies leaking of fluid. Review of Systems:   Pertinent items are noted in HPI Denies abnormal vaginal discharge w/ itching/odor/irritation, headaches, visual changes, shortness of breath, chest pain, abdominal pain, severe nausea/vomiting, or problems with urination or bowel movements unless otherwise stated above. Pertinent History Reviewed:  Reviewed past medical,surgical, social, obstetrical and family history.  Reviewed problem list, medications and allergies. Physical Assessment:   Vitals:   07/04/21 1429  BP: 128/81  Pulse: 79  Temp: 98.4 F (36.9 C)  Weight: 199 lb 9.6 oz (90.5 kg)  Body mass index is 33.73 kg/m.        Physical Examination:   General appearance: Well appearing, and in no distress  Mental status: Alert, oriented to person, place, and time  Skin: Warm & dry  Cardiovascular: Normal heart rate noted  Respiratory: Normal respiratory effort, no distress  Abdomen: Soft, gravid, nontender  Pelvic: Cervical exam performed  Dilation: 1 Effacement (%): Thick Station: Ballotable  Extremities: Edema: Moderate pitting, indentation subsides rapidly  Fetal Status: Fetal Heart Rate (bpm): 144 Fundal Height: 39 cm Movement: Present Presentation: Vertex  No results found for this or any previous visit (from the past 24 hour(s)).  Assessment & Plan:  1) Low-risk pregnancy G2P0010 at [redacted]w[redacted]d with an Estimated Date of Delivery: 07/26/21   2) Supervision of normal first pregnancy, antepartum  - Cervicovaginal ancillary only( [facility]),  - Culture, beta strep (group b only),  - Hepatitis C antibody - Y1P birth certificate submitted today -- scanned in media tab   3) [redacted] weeks gestation of pregnancy   Meds: No orders of the defined types were placed in this encounter.  Labs/procedures today: GBS, GC/CT, cervical exam  Plan:  Continue routine obstetrical care   Reviewed: Preterm labor symptoms and general obstetric precautions including but not limited to vaginal bleeding, contractions, leaking of fluid and fetal movement were reviewed in detail with the patient.  All questions were answered. Has home bp cuff. Check bp weekly, let us know if >140/90.   Follow-up: Return in about 1 week (around 07/11/2021) for Return OB visit.  Orders Placed This Encounter  Procedures   Culture, beta strep (group b only)   Hepatitis C antibody   Raelyn Mora MSN, CNM 07/04/2021 4:51 PM

## 2021-07-05 LAB — CERVICOVAGINAL ANCILLARY ONLY
Chlamydia: NEGATIVE
Comment: NEGATIVE
Comment: NEGATIVE
Comment: NORMAL
Neisseria Gonorrhea: NEGATIVE
Trichomonas: NEGATIVE

## 2021-07-05 LAB — HEPATITIS C ANTIBODY: Hep C Virus Ab: <0.1 s/co ratio (ref 0.0–0.9)

## 2021-07-06 ENCOUNTER — Encounter: Payer: Self-pay | Admitting: Obstetrics and Gynecology

## 2021-07-08 LAB — CULTURE, BETA STREP (GROUP B ONLY): Strep Gp B Culture: POSITIVE — AB

## 2021-07-11 ENCOUNTER — Inpatient Hospital Stay (HOSPITAL_COMMUNITY)
Admission: AD | Admit: 2021-07-11 | Discharge: 2021-07-15 | DRG: 806 | Disposition: A | Discharge status: Home / Self Care | Payer: Medicaid Other | Attending: Obstetrics and Gynecology | Admitting: Obstetrics and Gynecology

## 2021-07-11 ENCOUNTER — Ambulatory Visit (INDEPENDENT_AMBULATORY_CARE_PROVIDER_SITE_OTHER): Payer: Medicaid Other | Admitting: Obstetrics and Gynecology

## 2021-07-11 ENCOUNTER — Other Ambulatory Visit: Payer: Self-pay

## 2021-07-11 VITALS — BP 163/97 | HR 89 | Temp 98.4°F | Wt 203.4 lb

## 2021-07-11 DIAGNOSIS — O1414 Severe pre-eclampsia complicating childbirth: Principal | ICD-10-CM | POA: Diagnosis present

## 2021-07-11 DIAGNOSIS — Z3A37 37 weeks gestation of pregnancy: Secondary | ICD-10-CM

## 2021-07-11 DIAGNOSIS — O163 Unspecified maternal hypertension, third trimester: Secondary | ICD-10-CM

## 2021-07-11 DIAGNOSIS — O1404 Mild to moderate pre-eclampsia, complicating childbirth: Secondary | ICD-10-CM | POA: Diagnosis not present

## 2021-07-11 DIAGNOSIS — O9081 Anemia of the puerperium: Secondary | ICD-10-CM | POA: Diagnosis not present

## 2021-07-11 DIAGNOSIS — O99824 Streptococcus B carrier state complicating childbirth: Secondary | ICD-10-CM | POA: Diagnosis present

## 2021-07-11 DIAGNOSIS — Z3400 Encounter for supervision of normal first pregnancy, unspecified trimester: Secondary | ICD-10-CM | POA: Diagnosis not present

## 2021-07-11 DIAGNOSIS — R030 Elevated blood-pressure reading, without diagnosis of hypertension: Secondary | ICD-10-CM | POA: Diagnosis present

## 2021-07-11 DIAGNOSIS — D62 Acute posthemorrhagic anemia: Secondary | ICD-10-CM | POA: Diagnosis not present

## 2021-07-11 DIAGNOSIS — Z20822 Contact with and (suspected) exposure to covid-19: Secondary | ICD-10-CM | POA: Diagnosis present

## 2021-07-11 LAB — CBC
HCT: 35.7 % — ABNORMAL LOW (ref 36.0–46.0)
Hemoglobin: 12.1 g/dL (ref 12.0–15.0)
MCH: 31.8 pg (ref 26.0–34.0)
MCHC: 33.9 g/dL (ref 30.0–36.0)
MCV: 93.9 fL (ref 80.0–100.0)
RBC: 3.8 MIL/uL — ABNORMAL LOW (ref 3.87–5.11)
RDW: 14 % (ref 11.5–15.5)
WBC: 9.2 10*3/uL (ref 4.0–10.5)
nRBC: 0 % (ref 0.0–0.2)

## 2021-07-11 LAB — COMPREHENSIVE METABOLIC PANEL
ALT: 14 U/L (ref 0–44)
AST: 31 U/L (ref 15–41)
Albumin: 2.7 g/dL — ABNORMAL LOW (ref 3.5–5.0)
Alkaline Phosphatase: 118 U/L (ref 38–126)
Anion gap: 8 (ref 5–15)
BUN: 9 mg/dL (ref 6–20)
CO2: 20 mmol/L — ABNORMAL LOW (ref 22–32)
Calcium: 9.4 mg/dL (ref 8.9–10.3)
Chloride: 107 mmol/L (ref 98–111)
Creatinine, Ser: 0.76 mg/dL (ref 0.44–1.00)
GFR, Estimated: >60 mL/min (ref >60)
Glucose, Bld: 77 mg/dL (ref 70–99)
Potassium: 4.3 mmol/L (ref 3.5–5.1)
Sodium: 135 mmol/L (ref 135–145)
Total Bilirubin: 0.5 mg/dL (ref 0.3–1.2)
Total Protein: 5.8 g/dL — ABNORMAL LOW (ref 6.5–8.1)

## 2021-07-11 LAB — PROTEIN / CREATININE RATIO, URINE
Creatinine, Urine: 73.09 mg/dL
Protein Creatinine Ratio: 0.82 mg/mg{Cre} — ABNORMAL HIGH (ref 0.00–0.15)
Total Protein, Urine: 60 mg/dL

## 2021-07-11 LAB — RESP PANEL BY RT-PCR (FLU A&B, COVID) ARPGX2
Influenza A by PCR: NEGATIVE
Influenza B by PCR: NEGATIVE
SARS Coronavirus 2 by RT PCR: NEGATIVE

## 2021-07-11 LAB — TYPE AND SCREEN
ABO/RH(D): O POS
Antibody Screen: NEGATIVE

## 2021-07-11 MED ORDER — OXYCODONE-ACETAMINOPHEN 5-325 MG PO TABS: 1.0000 | ORAL_TABLET | ORAL | Status: DC | PRN

## 2021-07-11 MED ORDER — OXYTOCIN BOLUS FROM INFUSION
333.0000 mL | Freq: Once | INTRAVENOUS | Status: AC
  Administered 2021-07-13: 333 mL via INTRAVENOUS

## 2021-07-11 MED ORDER — LACTATED RINGERS IV SOLN
500.0000 mL | INTRAVENOUS | Status: DC | PRN
  Administered 2021-07-13 (×2): 500 mL via INTRAVENOUS

## 2021-07-11 MED ORDER — MISOPROSTOL 25 MCG QUARTER TABLET
25.0000 ug | ORAL_TABLET | ORAL | Status: DC
  Administered 2021-07-11: 25 ug via VAGINAL
  Filled 2021-07-11: qty 1

## 2021-07-11 MED ORDER — OXYTOCIN-SODIUM CHLORIDE 30-0.9 UT/500ML-% IV SOLN
2.5000 [IU]/h | INTRAVENOUS | Status: DC
  Administered 2021-07-13: 2.5 [IU]/h via INTRAVENOUS
  Filled 2021-07-11: qty 500

## 2021-07-11 MED ORDER — LACTATED RINGERS IV SOLN: INTRAVENOUS | Status: DC

## 2021-07-11 MED ORDER — ONDANSETRON HCL 4 MG/2ML IJ SOLN
4.0000 mg | Freq: Four times a day (QID) | INTRAMUSCULAR | Status: DC | PRN
  Administered 2021-07-13: 4 mg via INTRAVENOUS
  Filled 2021-07-11: qty 2

## 2021-07-11 MED ORDER — OXYCODONE-ACETAMINOPHEN 5-325 MG PO TABS: 2.0000 | ORAL_TABLET | ORAL | Status: DC | PRN

## 2021-07-11 MED ORDER — ACETAMINOPHEN 325 MG PO TABS: 650.0000 mg | ORAL_TABLET | ORAL | Status: DC | PRN

## 2021-07-11 MED ORDER — SOD CITRATE-CITRIC ACID 500-334 MG/5ML PO SOLN: 30.0000 mL | ORAL | Status: DC | PRN

## 2021-07-11 MED ORDER — PENICILLIN G POT IN DEXTROSE 60000 UNIT/ML IV SOLN
3.0000 10*6.[IU] | INTRAVENOUS | Status: DC
  Administered 2021-07-11 – 2021-07-13 (×10): 3 10*6.[IU] via INTRAVENOUS
  Filled 2021-07-11 (×10): qty 50

## 2021-07-11 MED ORDER — LIDOCAINE HCL (PF) 1 % IJ SOLN
30.0000 mL | INTRAMUSCULAR | Status: DC | PRN
  Filled 2021-07-11: qty 30

## 2021-07-11 MED ORDER — SODIUM CHLORIDE 0.9 % IV SOLN
5.0000 10*6.[IU] | Freq: Once | INTRAVENOUS | Status: AC
  Administered 2021-07-11: 5 10*6.[IU] via INTRAVENOUS
  Filled 2021-07-11: qty 5

## 2021-07-11 MED ORDER — FENTANYL CITRATE (PF) 100 MCG/2ML IJ SOLN
50.0000 ug | INTRAMUSCULAR | Status: DC | PRN
  Administered 2021-07-13 (×2): 100 ug via INTRAVENOUS
  Filled 2021-07-11 (×2): qty 2

## 2021-07-11 NOTE — H&P (Signed)
OBSTETRIC ADMISSION HISTORY AND PHYSICAL  Kayla Potts is a 30 y.o. female G2P0010 with IUP at [redacted]w[redacted]d by LMP presenting for IOL due to elevated BP in the office at term. She reports +FMs, no LOF, no VB, no blurry vision, headaches, or RUQ pain.  She has had increased swelling in her lower extremities recently.  She plans on breast and formula feeding. She is undecided about birth control after delivery.   She received her prenatal care at  [facility]    Dating: By LMP --->  Estimated Date of Delivery: 07/26/21  Sono:   @[redacted]w[redacted]d , CWD, normal anatomy, breech presentation, 715g, 58% EFW  Prenatal History/Complications:  Elevated BP at term   Past Medical History: Past Medical History:  Diagnosis Date   Medical history non-contributory    Pregnancy of unknown anatomic location 11/02/2018    Past Surgical History: Past Surgical History:  Procedure Laterality Date   NO PAST SURGERIES      Obstetrical History: OB History     Gravida  2   Para      Term      Preterm      AB  1   Living         SAB  1   IAB      Ectopic      Multiple      Live Births              Social History Social History   Socioeconomic History   Marital status: Single    Spouse name: Not on file   Number of children: Not on file   Years of education: Not on file   Highest education level: Bachelor's degree (e.g., BA, AB, BS)  Occupational History   Not on file  Tobacco Use   Smoking status: Never   Smokeless tobacco: Never  Vaping Use   Vaping Use: Never used  Substance and Sexual Activity   Alcohol use: Not Currently   Drug use: Never   Sexual activity: Yes    Birth control/protection: None  Other Topics Concern   Not on file  Social History Narrative   Not on file   Social Determinants of Health   Financial Resource Strain: Not on file  Food Insecurity: Not on file  Transportation Needs: Not on file  Physical Activity: Not on file  Stress: Not on file  Social  Connections: Not on file    Family History: No family history on file.  Allergies: No Known Allergies  Medications Prior to Admission  Medication Sig Dispense Refill Last Dose   Prenatal 27-1 MG TABS Take 1 tablet by mouth daily.   07/11/2021   Blood Pressure Monitoring (BLOOD PRESSURE MONITOR AUTOMAT) DEVI 1 Device by Does not apply route daily. Automatic blood pressure cuff regular size. To monitor blood pressure regularly at home. ICD-10 code:Z34.90 1 each 0    Doxylamine-Pyridoxine 10-10 MG TBEC Take 1 tablet by mouth 3 (three) times daily.      Misc. Devices (GOJJI WEIGHT SCALE) MISC 1 Device by Does not apply route daily as needed. To weight self daily as needed at home. ICD-10 code: Z34.90 1 each 0    ondansetron (ZOFRAN-ODT) 8 MG disintegrating tablet Take 1 tablet (8 mg total) by mouth every 8 (eight) hours as needed for nausea or vomiting. 30 tablet 1      Review of Systems  All systems reviewed and negative except as stated in HPI  Blood pressure (!) 155/93, pulse 75,  temperature 99.1 F (37.3 C), temperature source Oral, resp. rate 18, height 5' 4.5" (1.638 m), weight 91.6 kg, last menstrual period 10/19/2020.  General appearance: alert, cooperative, and no distress Lungs: normal work of breathing on room air Heart: normal rate Abdomen: soft, gravid, nontender to palpation  Extremities: 2+ pitting edema to LEs bilaterally to mid-shin  Presentation: cephalic Fetal monitoring: Baseline 140 bpm, moderate variability, + accels, - decels  Uterine activity: Every 4-8 minutes  Dilation: 1 Effacement (%): Thick Station: Ballotable Presentation: Vertex Exam by:: Mary [place] Johnson, RN   Prenatal labs: ABO, Rh: --/--/PENDING (08/25 1850) Antibody: PENDING (08/25 1850) Rubella: Immune (02/24 1601) RPR: Nonreactive (06/16 1616)  HBsAg: Negative (02/24 1601)  HIV: Non-reactive (06/16 1616)  GBS: Positive/-- (08/18 1457)  1 hr Glucola normal Genetic screening - Low  Risk Nips Anatomy US normal  Prenatal Transfer Tool  Maternal Diabetes: No Genetic Screening: Normal Maternal Ultrasounds/Referrals: Normal Fetal Ultrasounds or other Referrals:  None Maternal Substance Abuse:  No Significant Maternal Medications:  None Significant Maternal Lab Results: Group B Strep positive  Results for orders placed or performed during the hospital encounter of 07/11/21 (from the past 24 hour(s))  Type and screen [facility]   Collection Time: 07/11/21  6:50 PM  Result Value Ref Range   ABO/RH(D) PENDING    Antibody Screen PENDING    Sample Expiration      07/14/2021,2359 Performed at [facility], [address] [place], [place] [address]     Patient Active Problem List   Diagnosis Date Noted   Indication for care in labor or delivery 07/11/2021   Supervision of normal first pregnancy, antepartum 05/06/2021    Assessment/Plan:  Kayla Potts is a 30 y.o. G2P0010 at [redacted]w[redacted]d here for IOL due to elevated BP at term.  #Labor: SVE 1/thick/ballotable. Will start induction with vaginal Cytotec 25 mcg and reassess in 4 hours.  #Pain: PRN #FWB: Cat 1  #ID:  GBS positive > PCN #MOF: Breast and formula  #MOC: Undecided  #Elevated BP: BP elevated at 163/97 in office. + LE edema. BP on arrival 151/102. CBC, CMP, and PCR ordered. Concern for possible development of gHTN versus pre-eclampsia. Will closely monitor BP. Plan for Mg if patient has another severe range pressure or becomes symptomatic. Discussed with patient at bedside who voiced understanding and is agreeable to treatment as indicated.  Worthy Rancher, MD  OB Fellow  Faculty Practice 07/11/2021, 7:43 PM

## 2021-07-11 NOTE — Progress Notes (Signed)
   LOW-RISK PREGNANCY OFFICE VISIT Patient name: Rosetta Rupnow MRN [id number]  Date of birth: [birth year] Chief Complaint:   Routine Prenatal Visit  History of Present Illness:   Hermena Swint is a 30 y.o. G67P0010 female at [redacted]w[redacted]d with an Estimated Date of Delivery: 07/26/21 being seen today for ongoing management of a low-risk pregnancy.  Today she reports  swelling . She reports she has been losing her mucous plug. Contractions: Irregular. Vag. Bleeding: None.  Movement: Present. denies leaking of fluid. Review of Systems:   Pertinent items are noted in HPI Denies abnormal vaginal discharge w/ itching/odor/irritation, headaches, visual changes, shortness of breath, chest pain, abdominal pain, severe nausea/vomiting, or problems with urination or bowel movements unless otherwise stated above. Pertinent History Reviewed:  Reviewed past medical,surgical, social, obstetrical and family history.  Reviewed problem list, medications and allergies. Physical Assessment:   Vitals:   07/11/21 1635 07/11/21 1636  BP: (!) 166/110 (!) 163/97  Pulse: 87 89  Temp: 98.4 F (36.9 C)   Weight: 203 lb 6.4 oz (92.3 kg)   Body mass index is 34.37 kg/m.        Physical Examination:   General appearance: Well appearing, and in no distress  Mental status: Alert, oriented to person, place, and time  Skin: Warm & dry  Cardiovascular: Normal heart rate noted  Respiratory: Normal respiratory effort, no distress  Abdomen: Soft, gravid, nontender  Pelvic: Cervical exam deferred         Extremities: Edema: Moderate pitting, indentation subsides rapidly  Fetal Status: Fetal Heart Rate (bpm): 150 Fundal Height: 37 cm Movement: Present    No results found for this or any previous visit (from the past 24 hour(s)).  Assessment & Plan:  1) Low-risk pregnancy G2P0010 at [redacted]w[redacted]d with an Estimated Date of Delivery: 07/26/21   2) Elevated blood pressure affecting pregnancy in third trimester, antepartum - Direct  admission to L&D for IOL - Explained to patient that she will no longer be a candidate for water birth d/t gHTN or PEC - Labs to R/O PEC will be done upon admission to L&D  3) Supervision of normal first pregnancy, antepartum  4) [redacted] weeks gestation of pregnancy    Meds: No orders of the defined types were placed in this encounter.  Labs/procedures today: none  Plan:  Continue routine obstetrical care   Reviewed: Term labor symptoms and general obstetric precautions including but not limited to vaginal bleeding, contractions, leaking of fluid and fetal movement were reviewed in detail with the patient.  All questions were answered. Has home bp cuff. Check bp weekly, let us know if >140/90.   Follow-up: No follow-ups on file.  No orders of the defined types were placed in this encounter.  Raelyn Mora MSN, CNM 07/11/2021 5:03 PM

## 2021-07-12 LAB — RPR: RPR Ser Ql: NONREACTIVE

## 2021-07-12 MED ORDER — MISOPROSTOL 50MCG HALF TABLET: 50.0000 ug | ORAL_TABLET | ORAL | Status: DC

## 2021-07-12 MED ORDER — TERBUTALINE SULFATE 1 MG/ML IJ SOLN: 0.2500 mg | Freq: Once | INTRAMUSCULAR | Status: DC | PRN

## 2021-07-12 MED ORDER — MAGNESIUM SULFATE 40 GM/1000ML IV SOLN
2.0000 g/h | INTRAVENOUS | Status: DC
  Filled 2021-07-12: qty 1000

## 2021-07-12 MED ORDER — LABETALOL HCL 5 MG/ML IV SOLN
40.0000 mg | INTRAVENOUS | Status: DC | PRN
Start: 2021-07-12 — End: 2021-07-13
  Filled 2021-07-12: qty 8

## 2021-07-12 MED ORDER — ZOLPIDEM TARTRATE 5 MG PO TABS: 5.0000 mg | ORAL_TABLET | Freq: Every evening | ORAL | Status: DC | PRN

## 2021-07-12 MED ORDER — MAGNESIUM SULFATE BOLUS VIA INFUSION
4.0000 g | Freq: Once | INTRAVENOUS | Status: DC
  Filled 2021-07-12: qty 1000

## 2021-07-12 MED ORDER — LABETALOL HCL 5 MG/ML IV SOLN: 80.0000 mg | INTRAVENOUS | Status: DC | PRN

## 2021-07-12 MED ORDER — HYDRALAZINE HCL 20 MG/ML IJ SOLN
10.0000 mg | INTRAMUSCULAR | Status: DC | PRN
Start: 2021-07-12 — End: 2021-07-13

## 2021-07-12 MED ORDER — LABETALOL HCL 5 MG/ML IV SOLN
20.0000 mg | INTRAVENOUS | Status: DC | PRN
  Administered 2021-07-13: 20 mg via INTRAVENOUS
  Filled 2021-07-12: qty 4

## 2021-07-12 MED ORDER — OXYTOCIN-SODIUM CHLORIDE 30-0.9 UT/500ML-% IV SOLN
1.0000 m[IU]/min | INTRAVENOUS | Status: DC
  Administered 2021-07-12: 2 m[IU]/min via INTRAVENOUS
  Administered 2021-07-13: 22 m[IU]/min via INTRAVENOUS
  Filled 2021-07-12: qty 500

## 2021-07-12 NOTE — Progress Notes (Addendum)
Kayla Potts is a 30 y.o. G2P0010 at [redacted]w[redacted]d by LMP admitted for induction of labor due to [facility].  Subjective: Cook's FB in for 8+ hours. Needs reassessment.  Objective: BP (!) 156/84   Pulse 81   Temp 98.3 F (36.8 C) (Oral)   Resp 17   Ht 5' 4.5" (1.638 m)   Wt 91.6 kg   LMP 10/19/2020 (Exact Date)   BMI 34.12 kg/m  No intake/output data recorded. No intake/output data recorded.  FHT:  FHR: 140 bpm, variability: moderate,  accelerations:  Abscent,  decelerations:  Absent UC:   regular, every 2-3.5 minutes SVE:   Dilation: 6 Effacement (%): 70 Station: -2 Exam by:: Raelyn Mora CNM Pitocin started at 2 mU  Labs: Lab Results  Component Value Date   WBC 9.2 07/11/2021   HGB 12.1 07/11/2021   HCT 35.7 (L) 07/11/2021   MCV 93.9 07/11/2021   PLT DCLMP 07/11/2021    Assessment / Plan: Induction of labor due to preeclampsia,  progressing well with Cook's Foley Catheter  Labor: Progressing normally, will start Pitocin Preeclampsia:  labs stable Fetal Wellbeing:  Category I Pain Control:  Labor support without medications I/D:  n/a Anticipated MOD:  NSVD  Lynsi Dooner,CNM 07/12/2021, 10:21 AM

## 2021-07-12 NOTE — Progress Notes (Addendum)
Kayla Potts is a 30 y.o. G2P0010 at [redacted]w[redacted]d by LMP admitted for induction of labor due to PEC.  Subjective: No cervical change since 1030 this AM. Pain managed well using birthing ball, breathing exercises and labor support at bedside.  Objective: BP (!) 145/87   Pulse 78   Temp 98.2 F (36.8 C) (Oral)   Resp 16   Ht 5' 4.5" (1.638 m)   Wt 91.6 kg   LMP 10/19/2020 (Exact Date)   BMI 34.12 kg/m  No intake/output data recorded. No intake/output data recorded.  FHT:  FHR: 145 bpm, variability: moderate,  accelerations:  Present,  decelerations:  Absent UC:   regular, every 1.5-2 minutes  SVE:   Dilation: 6 cm Effacement (%): 80 Station: -2 Exam by:: Raelyn Mora, CNM AROM with copious amounts of thin meconium stained fluid IUPC placed without difficulty Pitocin @ 18 mU  Labs: Lab Results  Component Value Date   WBC 9.2 07/11/2021   HGB 12.1 07/11/2021   HCT 35.7 (L) 07/11/2021   MCV 93.9 07/11/2021   PLT DCLMP 07/11/2021    Assessment / Plan: Induction of labor due to preeclampsia,  progressing well on pitocin  Labor: Progressing on Pitocin, continue to titrate up Pitocin Preeclampsia:  labs stable Fetal Wellbeing:  Category I Pain Control:  Labor support without medications I/D:  n/a Anticipated MOD:  NSVD  Raelyn Mora, CNM 07/12/2021, 7:20 PM

## 2021-07-12 NOTE — Progress Notes (Signed)
Patient Vitals for the past 4 hrs:  BP Pulse Resp  07/12/21 0658 (!) 156/84 81 17   Foley still in. FHR Cat 1.  Ctx q 3 minutes, mild. Can give buccal cytotec now that

## 2021-07-12 NOTE — Progress Notes (Signed)
Patient ID: Kayla Potts, female   DOB: 12/16/1990, 30 y.o.   MRN: [id number] Sitting on ball coping well with contractions  Vitals:   07/12/21 1825 07/12/21 1835 07/12/21 1930 07/12/21 2000  BP: (!) 142/92 (!) 145/87 (!) 152/88 (!) 159/96  Pulse: 75 78 73 82  Resp: 16 16    Temp:      TempSrc:      Weight:      Height:       FHR reactive UCs every 2 min  Cervix exam deferred  Anticipate SVD

## 2021-07-12 NOTE — Progress Notes (Signed)
Patient Vitals for the past 4 hrs:  BP Temp Temp src Pulse Resp  07/12/21 0119 (!) 159/98 98.3 F (36.8 C) Oral 82 17  07/11/21 2306 (!) 158/88 98.9 F (37.2 C) Oral 86 16  07/11/21 2201 (!) 156/90 -- -- 83 16   Received 1 dose of cytotec; Ctx q 1-2 minutes, mild.  Cx 1.5/50/-2.  Cooks inserted and inflated w/80cc H20.  FHR Cat 1.   Preeclampsia w/o severe features:  has had 1 severe range BP in office today, borderline now.  Will start Mg+ if has another, or if HA develops  For now, will hold cytotec until ctx space out.

## 2021-07-13 DIAGNOSIS — O99824 Streptococcus B carrier state complicating childbirth: Secondary | ICD-10-CM

## 2021-07-13 DIAGNOSIS — Z3A37 37 weeks gestation of pregnancy: Secondary | ICD-10-CM

## 2021-07-13 DIAGNOSIS — O1404 Mild to moderate pre-eclampsia, complicating childbirth: Secondary | ICD-10-CM

## 2021-07-13 LAB — CBC
HCT: 36.3 % (ref 36.0–46.0)
Hemoglobin: 11.6 g/dL — ABNORMAL LOW (ref 12.0–15.0)
MCH: 31.1 pg (ref 26.0–34.0)
MCHC: 32 g/dL (ref 30.0–36.0)
MCV: 97.3 fL (ref 80.0–100.0)
Platelets: 93 10*3/uL — ABNORMAL LOW (ref 150–400)
RBC: 3.73 MIL/uL — ABNORMAL LOW (ref 3.87–5.11)
RDW: 14.3 % (ref 11.5–15.5)
WBC: 17.5 10*3/uL — ABNORMAL HIGH (ref 4.0–10.5)
nRBC: 0 % (ref 0.0–0.2)

## 2021-07-13 MED ORDER — MAGNESIUM SULFATE BOLUS VIA INFUSION
4.0000 g | Freq: Once | INTRAVENOUS | Status: AC
  Administered 2021-07-13: 4 g via INTRAVENOUS
  Filled 2021-07-13: qty 1000

## 2021-07-13 MED ORDER — SODIUM CHLORIDE 0.9 % IV SOLN: 2.0000 g | Freq: Four times a day (QID) | INTRAVENOUS | Status: DC

## 2021-07-13 MED ORDER — HYDRALAZINE HCL 20 MG/ML IJ SOLN: 10.0000 mg | INTRAMUSCULAR | Status: DC | PRN

## 2021-07-13 MED ORDER — OXYCODONE HCL 5 MG PO TABS: 5.0000 mg | ORAL_TABLET | ORAL | Status: DC | PRN

## 2021-07-13 MED ORDER — LABETALOL HCL 5 MG/ML IV SOLN
40.0000 mg | INTRAVENOUS | Status: DC | PRN
  Administered 2021-07-13: 40 mg via INTRAVENOUS

## 2021-07-13 MED ORDER — ACETAMINOPHEN 325 MG PO TABS
650.0000 mg | ORAL_TABLET | ORAL | Status: DC | PRN
Start: 2021-07-13 — End: 2021-07-15
  Administered 2021-07-14: 650 mg via ORAL
  Filled 2021-07-13 (×2): qty 2

## 2021-07-13 MED ORDER — SENNOSIDES-DOCUSATE SODIUM 8.6-50 MG PO TABS
2.0000 | ORAL_TABLET | Freq: Every day | ORAL | Status: DC
  Administered 2021-07-14 – 2021-07-15 (×2): 2 via ORAL
  Filled 2021-07-13 (×2): qty 2

## 2021-07-13 MED ORDER — COCONUT OIL OIL: 1.0000 "application " | TOPICAL_OIL | Status: DC | PRN

## 2021-07-13 MED ORDER — CLINDAMYCIN PHOSPHATE 900 MG/50ML IV SOLN
900.0000 mg | Freq: Three times a day (TID) | INTRAVENOUS | Status: DC
  Administered 2021-07-13 – 2021-07-14 (×3): 900 mg via INTRAVENOUS
  Filled 2021-07-13 (×3): qty 50

## 2021-07-13 MED ORDER — GENTAMICIN SULFATE 40 MG/ML IJ SOLN
5.0000 mg/kg | INTRAVENOUS | Status: DC
  Administered 2021-07-13: 350 mg via INTRAVENOUS
  Filled 2021-07-13 (×2): qty 8.75

## 2021-07-13 MED ORDER — BENZOCAINE-MENTHOL 20-0.5 % EX AERO
1.0000 "application " | INHALATION_SPRAY | CUTANEOUS | Status: DC | PRN
  Filled 2021-07-13: qty 56

## 2021-07-13 MED ORDER — DIPHENOXYLATE-ATROPINE 2.5-0.025 MG/5ML PO LIQD
10.0000 mL | Freq: Once | ORAL | Status: AC
  Administered 2021-07-13: 10 mL via ORAL
  Filled 2021-07-13: qty 10

## 2021-07-13 MED ORDER — TRANEXAMIC ACID-NACL 1000-0.7 MG/100ML-% IV SOLN
INTRAVENOUS | Status: AC
  Administered 2021-07-13: 1000 mg via INTRAVENOUS
  Filled 2021-07-13: qty 100

## 2021-07-13 MED ORDER — WITCH HAZEL-GLYCERIN EX PADS: 1.0000 "application " | MEDICATED_PAD | CUTANEOUS | Status: DC | PRN

## 2021-07-13 MED ORDER — TRANEXAMIC ACID-NACL 1000-0.7 MG/100ML-% IV SOLN: 1000.0000 mg | INTRAVENOUS | Status: AC

## 2021-07-13 MED ORDER — MISOPROSTOL 200 MCG PO TABS: 800.0000 ug | ORAL_TABLET | Freq: Once | ORAL | Status: AC

## 2021-07-13 MED ORDER — DIPHENHYDRAMINE HCL 25 MG PO CAPS: 25.0000 mg | ORAL_CAPSULE | Freq: Four times a day (QID) | ORAL | Status: DC | PRN

## 2021-07-13 MED ORDER — CARBOPROST TROMETHAMINE 250 MCG/ML IM SOLN
INTRAMUSCULAR | Status: AC
  Filled 2021-07-13: qty 1

## 2021-07-13 MED ORDER — OXYCODONE HCL 5 MG PO TABS: 10.0000 mg | ORAL_TABLET | ORAL | Status: DC | PRN

## 2021-07-13 MED ORDER — DIPHENHYDRAMINE HCL 50 MG/ML IJ SOLN
INTRAMUSCULAR | Status: AC
  Administered 2021-07-13: 25 mg via INTRAVENOUS
  Filled 2021-07-13: qty 1

## 2021-07-13 MED ORDER — MISOPROSTOL 200 MCG PO TABS
ORAL_TABLET | ORAL | Status: AC
  Administered 2021-07-13: 800 ug via RECTAL
  Filled 2021-07-13: qty 4

## 2021-07-13 MED ORDER — LABETALOL HCL 5 MG/ML IV SOLN: 20.0000 mg | INTRAVENOUS | Status: DC | PRN

## 2021-07-13 MED ORDER — ZOLPIDEM TARTRATE 5 MG PO TABS: 5.0000 mg | ORAL_TABLET | Freq: Every evening | ORAL | Status: DC | PRN

## 2021-07-13 MED ORDER — IBUPROFEN 600 MG PO TABS
600.0000 mg | ORAL_TABLET | Freq: Four times a day (QID) | ORAL | Status: DC
  Administered 2021-07-13 – 2021-07-15 (×4): 600 mg via ORAL
  Filled 2021-07-13 (×4): qty 1

## 2021-07-13 MED ORDER — CARBOPROST TROMETHAMINE 250 MCG/ML IM SOLN
250.0000 ug | Freq: Once | INTRAMUSCULAR | Status: AC
  Administered 2021-07-13: 250 ug via INTRAMUSCULAR

## 2021-07-13 MED ORDER — PRENATAL MULTIVITAMIN CH
1.0000 | ORAL_TABLET | Freq: Every day | ORAL | Status: DC
  Administered 2021-07-14 – 2021-07-15 (×2): 1 via ORAL
  Filled 2021-07-13 (×2): qty 1

## 2021-07-13 MED ORDER — ONDANSETRON HCL 4 MG/2ML IJ SOLN: 4.0000 mg | INTRAMUSCULAR | Status: DC | PRN

## 2021-07-13 MED ORDER — LABETALOL HCL 5 MG/ML IV SOLN: 80.0000 mg | INTRAVENOUS | Status: DC | PRN

## 2021-07-13 MED ORDER — DIBUCAINE (PERIANAL) 1 % EX OINT: 1.0000 "application " | TOPICAL_OINTMENT | CUTANEOUS | Status: DC | PRN

## 2021-07-13 MED ORDER — SIMETHICONE 80 MG PO CHEW: 80.0000 mg | CHEWABLE_TABLET | ORAL | Status: DC | PRN

## 2021-07-13 MED ORDER — ONDANSETRON HCL 4 MG PO TABS: 4.0000 mg | ORAL_TABLET | ORAL | Status: DC | PRN

## 2021-07-13 MED ORDER — MAGNESIUM SULFATE 40 GM/1000ML IV SOLN
2.0000 g/h | INTRAVENOUS | Status: AC
  Administered 2021-07-14: 2 g/h via INTRAVENOUS
  Filled 2021-07-13: qty 1000

## 2021-07-13 MED ORDER — DIPHENHYDRAMINE HCL 50 MG/ML IJ SOLN: 25.0000 mg | Freq: Once | INTRAMUSCULAR | Status: AC

## 2021-07-13 NOTE — Progress Notes (Signed)
Suction for Kayla Potts stopped and cervical balloon reduced. After 30 minutes, Kayla Potts removed and bleeding appropriate. Stable for transfer to postpartum unit.   Evalina Field, MD  OB Fellow  Faculty Practice

## 2021-07-13 NOTE — Progress Notes (Signed)
Patient ID: Kayla Potts, female   DOB: Dec 15, 1990, 30 y.o.   MRN: [id number] Has had another episode of severe range pressures  Vitals:   07/13/21 0430 07/13/21 0500 07/13/21 0510 07/13/21 0520  BP: (!) 157/88 (!) 162/83 (!) 154/80 (!) 170/86  Pulse: 96 98 93 97  Resp: 16 17 17 16   Temp:      TempSrc:      Weight:      Height:       FHR reassuring but Cat 2 for fewer accels UCs 3-64min Pitocin has been restarted  Dilation: 8 Effacement (%): 80 (swollen) Cervical Position: Posterior (deviated to maternal RT) Station: -1 Presentation: Vertex Exam by:: [email or web address], RN  Will start Magnesium Sulfate due to two episodes of  severe range BPs

## 2021-07-13 NOTE — Progress Notes (Signed)
Patient ID: Kayla Potts, female   DOB: 07/27/1991, 30 y.o.   MRN: [id number] Vitals:   07/13/21 0131 07/13/21 0137 07/13/21 0400 07/13/21 0410  BP: (!) 177/139 (!) 156/82 (!) 161/71 (!) 168/83  Pulse: (!) 263 97 (!) 103 (!) 102  Resp:      Temp:      TempSrc:      Weight:      Height:       Labetalol given  UCs are regular but not optimal in strength FHR reactive Dilation: 8.5 Effacement (%): 90 Cervical Position: Posterior (deviated to maternal RT) Station: -1 Presentation: Vertex Exam by:: Jonelle Sidle, RN  Will try stopping Pitocin for an hour and having patient rest, hands and knees or just try to sleep  Then restart pitocin at half and see if we can strengthen UCs

## 2021-07-13 NOTE — Progress Notes (Signed)
Labor Progress Note Kayla Potts is a 30 y.o. G2P0010 at [redacted]w[redacted]d presented for IOL for PEC.  S:  Pt resting in bed with sister at bedside for support, FOB asleep on couch. Having severe back pain and vaginal pressure with contractions, declines epidural but willing to try nitrous and heating pad on back. Amenable to continuing position changes.  O:  BP 128/64   Pulse 96   Temp 98.8 F (37.1 C) (Oral)   Resp 17   Ht 5' 4.5" (1.638 m)   Wt 201 lb 14.4 oz (91.6 kg)   LMP 10/19/2020 (Exact Date)   BMI 34.12 kg/m  EFM: baseline 130 bpm/ minimal variability/ 10x10 accels (periods with no accels)/ occasional decels  Toco/IUPC: q4-36min SVE: Dilation: 8 Effacement (%): 80 Cervical Position: Middle Station: 0 Presentation: Vertex Exam by:: Marcelle Overlie RN Pitocin: 14 mu/min  A/P: 30 y.o. G2P0010 [redacted]w[redacted]d  1. Labor: Prolonged active stage, cervix has some swelling but has not changed in amount overnight. Has already had IV benadryl. No caput, suspected OP fetal position.  2. FWB: Cat 2, MD aware, has periods of more reactivity, will monitor closely for fetal intolerance of labor. Pt given anticipatory guidance given re close monitoring of baby's status and her progression of labor. Cannot continue to labor if baby stops tolerating contractions. Pt expressed understanding. 3. Pain: somewhat well tolerated with deep breathing and heating pad, to start nitrous. Discussed epidural, pt adamantly opposed despite reframing it as a tool to help her body allow the baby to progress, esp with the difficult positioning.  4. PEC: BP stable on mag, no s/sx of toxicity.  Cautiously anticipate SVD, MD aware and to assess need for surgical birth within the next few hours if no cervical dilation.  Bernerd Limbo, CNM 10:45 AM

## 2021-07-13 NOTE — Progress Notes (Signed)
Labor Progress Note Kaity Pitstick is a 30 y.o. G2P0010 at [redacted]w[redacted]d presented for IOL for PEC.  S:  Pt resting in bed on hands and knees with FOB at bedside for support, music playing, pt using nitrous occasionally. C/o worsening contractions with some pressure.  O:  BP (!) 151/92   Pulse 96   Temp 99 F (37.2 C) (Oral)   Resp 18   Ht 5' 4.5" (1.638 m)   Wt 201 lb 14.4 oz (91.6 kg)   LMP 10/19/2020 (Exact Date)   BMI 34.12 kg/m  EFM: baseline 140 bpm/ minimal variability/ 10x10 accels occasional variable decels  Toco/IUPC: q3-108min SVE: 9.5/0/+1 Pitocin: 22 mu/min  A/P: 30 y.o. G2P0010 [redacted]w[redacted]d  1. Labor: Progressing well into transition after prolonged active stage. Encouraged pt to continue with position changes and given much reassurance about improved descent and cervical change. 2. FWB: Cat 1, improved, now reassuring, MD aware of improved variability 3. Pain: well-tolerated with nitrous and FOB at bedside 4. PEC: BP stable on mag, no s/sx of toxicity.  Anticipate SVD, MD aware of improved labor progression.  Bernerd Limbo, CNM 2:22 PM

## 2021-07-13 NOTE — Lactation Note (Addendum)
This note was copied from a baby's chart. Lactation Consultation Note  Patient Name: Kayla Potts Today's Date: 07/13/2021 Reason for consult: L&D Initial assessment;1st time breastfeeding;Early term 37-38.6wks Age:28 hours Mom with hx" GHTN, pre-eclampsia: Magnesium, Postpartum hemorrhage  LC entered L&D mom was doing skin to skin per mom, infant latched earlier was still cuing to breastfeed. Mom latched infant on her right breast using the football hold, infant latched with depth, breastfeed for 15 minutes and was still breastfeeding when [facility] left the room. Mom would like to receive a "Stork Pump" she has insurance. LC discussed infant's input and output. Mom understands to breastfeed infant according to primal cues: licking, kissing, tasting, smacking, rotting, hands and fist in mouth ,skin to skin.  Mom knows to call RN or LC on MBU  if she needs further assistance with latching infant at the breast.  Maternal Data Has patient been taught Hand Expression?: Yes Does the patient have breastfeeding experience prior to this delivery?: No  Feeding Mother's Current Feeding Choice: Breast Milk and Formula  LATCH Score Latch: Grasps breast easily, tongue down, lips flanged, rhythmical sucking.  Audible Swallowing: A few with stimulation  Type of Nipple: Everted at rest and after stimulation  Comfort (Breast/Nipple): Soft / non-tender  Hold (Positioning): Assistance needed to correctly position infant at breast and maintain latch.  LATCH Score: 8   Lactation Tools Discussed/Used    Interventions Interventions: Breast feeding basics reviewed;Assisted with latch;Skin to skin;Breast massage;Position options;Support pillows;Adjust position;Breast compression;Education  Discharge Pump:  (Mom is interested in recieving a Stork Pump, she has insurance.) WIC Program: Yes  Consult Status Consult Status: Follow-up Date: 07/14/21 Follow-up type: In-patient    Danelle Earthly 07/13/2021, 8:11 PM

## 2021-07-13 NOTE — Progress Notes (Signed)
Called to room to assess bleeding, pt up to bathroom and continued trickle noted. Discussed with Dr. Para March need to place [place], she agreed. Pt educated and consented to [place] placement. Fentanyl given. Cleone Slim, CNM placed [place], pt tolerated placement well. Suction connected. Will reevaluate bleeding in .  Edd Arbour, CNM, MSN, IBCLC Certified Nurse Midwife, [facility]

## 2021-07-13 NOTE — Discharge Summary (Addendum)
Postpartum Discharge Summary  Date of Service updated 07/15/2021     Patient Name: Kayla Potts DOB: Dec 31, 1990 MRN: [id number]  Date of admission: 07/11/2021 Delivery date:07/13/2021  Delivering provider: Gaylan Gerold R  Date of discharge: 07/15/2021  Admitting diagnosis: Indication for care in labor or delivery [O75.9] Intrauterine pregnancy: [redacted]w[redacted]d    Secondary diagnosis:  Active Problems:   Indication for care in labor or delivery  Additional problems: [place]   Discharge diagnosis: Term Pregnancy Delivered and Preeclampsia (severe)                                              Post partum procedures: Jada Augmentation: AROM, Pitocin, Cytotec, and IP Foley Complications: [id number] Hospital course: Induction of Labor With Vaginal Delivery   30y.o. yo G2P0010 at 28w1das admitted to the hospital 07/11/2021 for induction of labor.  Indication for induction: Preeclampsia.  Patient had an uncomplicated labor course as follows: Membrane Rupture Time/Date: 7:10 PM ,07/12/2021   Delivery Method:Vaginal, Spontaneous  Episiotomy: None  Lacerations:  1st degree  Details of delivery can be found in separate delivery note.  Patient had a postpartum hemorrhage 1200 ml and Jada was placed and she received IV Venofer. Blood pressure was controlled with Procardia and she had magnesium postpartum.Patient is discharged home 07/15/21.  Newborn Data: Birth date:07/13/2021  Birth time:4:01 PM  Gender:Female  Living status:Living  Apgars:9 ,9  Weight:3260 g   Magnesium Sulfate received: Yes: Seizure prophylaxis BMZ received: No Rhophylac:N/A MMR:N/A T-DaP: declined Flu: No Transfusion:No  Physical exam  Vitals:   07/15/21 0343 07/15/21 0753 07/15/21 1129 07/15/21 1554  BP: 136/61 111/68 (!) 125/54 (!) 130/57  Pulse: 78 68 83 82  Resp: 17 19 18 18   Temp: 98.3 F (36.8 C) 98.5 F (36.9 C) 98.1 F (36.7 C) 98.4 F (36.9 C)  TempSrc: Oral Oral Oral Oral  SpO2: 100% 100%  100% 98%  Weight:      Height:       General: alert, cooperative, and no distress Lochia: appropriate Uterine Fundus: firm Incision: N/A DVT Evaluation: No evidence of DVT seen on physical exam. Labs: Lab Results  Component Value Date   WBC 11.4 (H) 07/15/2021   HGB 7.2 (L) 07/15/2021   HCT 21.6 (L) 07/15/2021   MCV 94.7 07/15/2021   PLT 127 (L) 07/15/2021   CMP Latest Ref Rng & Units 07/15/2021  Glucose 70 - 99 mg/dL 76  BUN 6 - 20 mg/dL 13  Creatinine 0.44 - 1.00 mg/dL 0.87  Sodium 135 - 145 mmol/L 137  Potassium 3.5 - 5.1 mmol/L 4.2  Chloride 98 - 111 mmol/L 106  CO2 22 - 32 mmol/L 25  Calcium 8.9 - 10.3 mg/dL 8.6(L)  Total Protein 6.5 - 8.1 g/dL 4.9(L)  Total Bilirubin 0.3 - 1.2 mg/dL 0.9  Alkaline Phos 38 - 126 U/L 82  AST 15 - 41 U/L 49(H)  ALT 0 - 44 U/L 17   Edinburgh Score: Edinburgh Postnatal Depression Scale Screening Tool 07/14/2021  I have been able to laugh and see the funny side of things. 0  I have looked forward with enjoyment to things. 0  I have blamed myself unnecessarily when things went wrong. 2  I have been anxious or worried for no good reason. 1  I have felt scared or panicky for no good reason.  0  Things have been getting on top of me. 2  I have been so unhappy that I have had difficulty sleeping. 0  I have felt sad or miserable. 0  I have been so unhappy that I have been crying. 0  The thought of harming myself has occurred to me. 0  Edinburgh Postnatal Depression Scale Total 5     After visit meds:  Allergies as of 07/15/2021   No Known Allergies      Medication List     TAKE these medications    Blood Pressure Monitor Automat Devi 1 Device by Does not apply route daily. Automatic blood pressure cuff regular size. To monitor blood pressure regularly at home. ICD-10 code:Z34.90   Doxylamine-Pyridoxine 10-10 MG Tbec Take 1 tablet by mouth 3 (three) times daily.   Gojji Weight Scale Misc 1 Device by Does not apply route daily as  needed. To weight self daily as needed at home. ICD-10 code: Z34.90   ibuprofen 600 MG tablet Commonly known as: ADVIL Take 1 tablet (600 mg total) by mouth every 6 (six) hours.   NIFEdipine 30 MG 24 hr tablet Commonly known as: ADALAT CC Take 1 tablet (30 mg total) by mouth daily. Start taking on: July 16, 2021   ondansetron 8 MG disintegrating tablet Commonly known as: ZOFRAN-ODT Take 1 tablet (8 mg total) by mouth every 8 (eight) hours as needed for nausea or vomiting.   Prenatal 27-1 MG Tabs Take 1 tablet by mouth daily.               Durable Medical Equipment  (From admission, onward)           Start     Ordered   07/14/21 1344  For home use only DME double electric breast pump  Once       Comments: ICD-10 code Z39.1   07/14/21 1343             Discharge home in stable condition Infant Feeding: Bottle and Breast Infant Disposition:rooming in Discharge instruction: per After Visit Summary and Postpartum booklet. Activity: Advance as tolerated. Pelvic rest for 6 weeks.  Diet: routine diet Future Appointments: Future Appointments  Date Time Provider [place]  07/19/2021 10:00 AM [place] None  08/15/2021  3:00 PM Laury Deep, CNM [facility] None   Follow up Visit:  Follow-up Information     [facility] Follow up in 1 week(s).   Specialty: Obstetrics and Gynecology Why: BP Contact information: [place] [phone]                Please schedule this patient for a In person postpartum visit in 6 weeks with the following provider: Any provider. Additional Postpartum F/U:BP check 1 week  Low risk pregnancy complicated by:  None, developed PEC late in pregnancy Delivery mode:  Vaginal, Spontaneous  Anticipated Birth Control:  Unsure   07/15/2021 Emeterio Reeve, MD

## 2021-07-14 LAB — CBC
HCT: 22.7 % — ABNORMAL LOW (ref 36.0–46.0)
Hemoglobin: 7.5 g/dL — ABNORMAL LOW (ref 12.0–15.0)
MCH: 31.5 pg (ref 26.0–34.0)
MCHC: 33 g/dL (ref 30.0–36.0)
MCV: 95.4 fL (ref 80.0–100.0)
Platelets: 102 10*3/uL — ABNORMAL LOW (ref 150–400)
RBC: 2.38 MIL/uL — ABNORMAL LOW (ref 3.87–5.11)
RDW: 14.4 % (ref 11.5–15.5)
WBC: 23.2 10*3/uL — ABNORMAL HIGH (ref 4.0–10.5)
nRBC: 0 % (ref 0.0–0.2)

## 2021-07-14 MED ORDER — NIFEDIPINE ER OSMOTIC RELEASE 30 MG PO TB24
30.0000 mg | ORAL_TABLET | Freq: Every day | ORAL | Status: DC
  Administered 2021-07-14 – 2021-07-15 (×2): 30 mg via ORAL
  Filled 2021-07-14 (×2): qty 1

## 2021-07-14 MED ORDER — SODIUM CHLORIDE 0.9 % IV SOLN
500.0000 mg | Freq: Once | INTRAVENOUS | Status: AC
  Administered 2021-07-15: 500 mg via INTRAVENOUS
  Filled 2021-07-14 (×2): qty 25

## 2021-07-14 NOTE — Lactation Note (Signed)
This note was copied from a baby's chart. Lactation Consultation Note  Patient Name: Kayla Potts [id number] Date: 07/14/2021 Reason for consult: Follow-up assessment;Early term 37-38.6wks;Primapara;1st time breastfeeding Age:28 hours  LC in to visit with P1 Mom of ET infant.  Baby at 2.8% weight loss with 2 stools and 1 void.   Baby in crib sucking vigorously on pacifier.  Mom using a hand pump and colostrum being expressed.  Suggested Mom latch baby to the breast.  Mom stated that baby had just fed.  Reassured Mom that baby wouldn't latch and feed if she wasn't hungry.  Assisted Mom with cross cradle hold.  Mom needing some guidance on hand support etc.  Baby latched deeply and relaxed and nutritive sucking noted.  Mom taught to use alternate breast compression.   Encouraged STS and feeding baby often with cues.  Reminded Mom of normal cluster feeding patterns in 2nd day of life.   Mom is interested in a STORK pump.  RN aware and given instructions on process.  LATCH Score Latch: Grasps breast easily, tongue down, lips flanged, rhythmical sucking.  Audible Swallowing: Spontaneous and intermittent  Type of Nipple: Everted at rest and after stimulation  Comfort (Breast/Nipple): Soft / non-tender  Hold (Positioning): Assistance needed to correctly position infant at breast and maintain latch.  LATCH Score: 9   Lactation Tools Discussed/Used Tools: Pump Breast pump type: Manual  Interventions Interventions: Breast feeding basics reviewed;Assisted with latch;Skin to skin;Breast massage;Hand express;Breast compression;Pre-pump if needed;Adjust position;Support pillows;Position options;Education  Discharge Pump: Stork Pump (Mom's RN to request STORK pump on EPIC)  Consult Status Consult Status: Follow-up Date: 07/15/21 Follow-up type: In-patient    Judee Clara 07/14/2021, 12:57 PM

## 2021-07-14 NOTE — Progress Notes (Signed)
Post Partum Day 1 s/p SVD c/b PPH and PreE with severe features Subjective: no complaints, up ad lib, voiding, tolerating PO, and + flatus  Objective: Blood pressure (!) 142/66, pulse 83, temperature 97.9 F (36.6 C), temperature source Oral, resp. rate 18, height 5' 4.5" (1.638 m), weight 91.6 kg, last menstrual period 10/19/2020, SpO2 98 %.  Physical Exam:  General: alert, cooperative, and no distress Lochia: appropriate Uterine Fundus: firm Incision: n/a DVT Evaluation: No evidence of DVT seen on physical exam. No cords or calf tenderness.  Recent Labs    07/13/21 0939 07/14/21 0553  HGB 11.6* 7.5*  HCT 36.3 22.7*    Assessment/Plan: Postpartum hemorrhage due to atony - Now stable without issues since yesterday evening once Jada once removed. She is also s/p Hemabate, cytotec and pitocin.  - She is on ABX for 24 hours for infection prophylaxis related to repeated uterine sweeps and Jada presence. She is afebrile and VSS other stable - Due to anemia related to acute blood loss, will do Venofer. She is otherwise asymptomatic  PreE with Severe features - Mag until 1600 - Procardia started this morning. Discussed goal and intent of care with pt. She is agreeable to medication  Routine PP care - Breastfeeding - Undecided about birth control - Meeting all routine PP goals otherwise - Disposition anticipated to home likely tomorrow   LOS: 3 days   Milas Hock 07/14/2021, 7:34 AM

## 2021-07-15 ENCOUNTER — Encounter (HOSPITAL_COMMUNITY): Payer: Self-pay | Admitting: Obstetrics and Gynecology

## 2021-07-15 LAB — CBC
HCT: 21.6 % — ABNORMAL LOW (ref 36.0–46.0)
Hemoglobin: 7.2 g/dL — ABNORMAL LOW (ref 12.0–15.0)
MCH: 31.6 pg (ref 26.0–34.0)
MCHC: 33.3 g/dL (ref 30.0–36.0)
MCV: 94.7 fL (ref 80.0–100.0)
Platelets: 127 10*3/uL — ABNORMAL LOW (ref 150–400)
RBC: 2.28 MIL/uL — ABNORMAL LOW (ref 3.87–5.11)
RDW: 14.8 % (ref 11.5–15.5)
WBC: 11.4 10*3/uL — ABNORMAL HIGH (ref 4.0–10.5)
nRBC: 0 % (ref 0.0–0.2)

## 2021-07-15 LAB — COMPREHENSIVE METABOLIC PANEL
ALT: 17 U/L (ref 0–44)
AST: 49 U/L — ABNORMAL HIGH (ref 15–41)
Albumin: 2.2 g/dL — ABNORMAL LOW (ref 3.5–5.0)
Alkaline Phosphatase: 82 U/L (ref 38–126)
Anion gap: 6 (ref 5–15)
BUN: 13 mg/dL (ref 6–20)
CO2: 25 mmol/L (ref 22–32)
Calcium: 8.6 mg/dL — ABNORMAL LOW (ref 8.9–10.3)
Chloride: 106 mmol/L (ref 98–111)
Creatinine, Ser: 0.87 mg/dL (ref 0.44–1.00)
GFR, Estimated: >60 mL/min (ref >60)
Glucose, Bld: 76 mg/dL (ref 70–99)
Potassium: 4.2 mmol/L (ref 3.5–5.1)
Sodium: 137 mmol/L (ref 135–145)
Total Bilirubin: 0.9 mg/dL (ref 0.3–1.2)
Total Protein: 4.9 g/dL — ABNORMAL LOW (ref 6.5–8.1)

## 2021-07-15 MED ORDER — IBUPROFEN 600 MG PO TABS: 600.0000 mg | ORAL_TABLET | Freq: Four times a day (QID) | ORAL | 0 refills | Status: DC

## 2021-07-15 MED ORDER — NIFEDIPINE ER 30 MG PO TB24: 30.0000 mg | ORAL_TABLET | Freq: Every day | ORAL | 0 refills | Status: DC

## 2021-07-15 NOTE — Progress Notes (Signed)
Post Partum Day 2 Subjective: Has consulted lactation for advice  Objective: Blood pressure 111/68, pulse 68, temperature 98.5 F (36.9 C), temperature source Oral, resp. rate 19, height 5' 4.5" (1.638 m), weight 91.6 kg, last menstrual period 10/19/2020, SpO2 100 %, unknown if currently breastfeeding.  Physical Exam:  General: alert, cooperative, and no distress Lochia: appropriate Uterine Fundus: firm DVT Evaluation: No evidence of DVT seen on physical exam.  Recent Labs    07/14/21 0553 07/15/21 0454  HGB 7.5* 7.2*  HCT 22.7* 21.6*    Assessment/Plan: PPD 2 BP controlled on Procardia, s/p magnesium IV Lactation Venofer infusion to complete. Assess for D/C later   LOS: 4 days   Scheryl Darter 07/15/2021, 10:18 AM

## 2021-07-15 NOTE — Lactation Note (Signed)
This note was copied from a baby's chart. Lactation Consultation Note  Patient Name: Girl Shemika Robbs [id number] Date: 07/15/2021 Reason for consult: Follow-up assessment;Early term 37-38.6wks;1st time breastfeeding;Primapara Age:28 hours  P1 mother whose infant is now 72 hours old.  This is an ETI at 38+1 weeks.  Baby has an 8% weight loss this morning.  RN requested lactation services; mother has been feeding frequently and baby has not been satisfied after breast feeding.  RN provided information regarding donor breast milk but mother was uncertain as to her plan prior to my arrival.  Discussed mother's current feeding plan with her.  Baby has been feeding for long durations and has not been content after feedings.  Reviewed hand expression with mother.  Weight loss up from 2.8% to now 8% this morning.  Suggested mother continue to breast feed on cue followed by supplementation and pumping for 15 minutes after every feeding.  Discussed donor milk and mother willing to sign the consent and begin with the next feeding.    Reviewed pumping and assessed #24 flanges to be appropriate at this time.  Suggested coconut oil to the nipple before/after feedings and also for flange lubrication as desired.  Mother had a few drops of colostrum in the bottom of her bottles from pumping.  She was unaware of the time for milk storage and drops discarded.  From this point forward she will feed back any EBM she obtains to baby.  Suggested mother call her RN/LC for latch assistance as needed.  No support person present today at all; mother will be alone.  Wash station set up and pump pieces cleaned for mother.  RN updated.  Mother appreciative.    Maternal Data Has patient been taught Hand Expression?: Yes Does the patient have breastfeeding experience prior to this delivery?: No  Feeding Mother's Current Feeding Choice: Breast Milk and Donor Milk  LATCH Score                    Lactation  Tools Discussed/Used Tools: Pump;Flanges;Coconut oil Flange Size: 24 Breast pump type: Manual;Double-Electric Breast Pump (Mother using her own personal DEBP) Pump Education: Setup, frequency, and cleaning;Milk Storage Reason for Pumping: Breast stimulation and supplementation for ETI Pumping frequency: Every three hours  Interventions    Discharge Pump: DEBP;Manual;Personal WIC Program: Yes  Consult Status Consult Status: Follow-up Date: 07/16/21 Follow-up type: In-patient    Mikhi Athey R Enrrique Mierzwa 07/15/2021, 8:52 AM

## 2021-07-16 ENCOUNTER — Ambulatory Visit: Payer: Self-pay

## 2021-07-16 NOTE — Lactation Note (Addendum)
This note was copied from a baby's chart. Lactation Consultation Note  Patient Name: Kayla Potts XNATF'T Date: 07/16/2021 Reason for consult: Follow-up assessment Age:28 hours  Mother latched baby in cross cradle hold with ease. She will supplement with 15 ml of DM and 5 ml of her breastmilk after feeding. Mother knows to increase volume with day of life and as baby desires.  LC will get larger volume bottle of DM for baby.  Mother is pumping frequently and has DEBP in room.  Reviewed engorgement care and monitoring voids/stools. Feed on demand with cues.  Goal 8-12+ times per day after first 24 hrs.  Place baby STS if not cueing.     Feeding Mother's Current Feeding Choice: Breast Milk and Donor Milk  LATCH Score Latch: Grasps breast easily, tongue down, lips flanged, rhythmical sucking.  Audible Swallowing: A few with stimulation  Type of Nipple: Everted at rest and after stimulation  Comfort (Breast/Nipple): Filling, red/small blisters or bruises, mild/mod discomfort  Hold (Positioning): No assistance needed to correctly position infant at breast.  LATCH Score: 8   Lactation Tools Discussed/Used Tools: Pump Flange Size: 24 Breast pump type: Double-Electric Breast Pump Pump Education: Milk Storage Reason for Pumping: stimulation and supplementation Pumping frequency: after feedings Pumped volume: 5 mL  Interventions Interventions: Breast feeding basics reviewed;Breast massage;Coconut oil;DEBP;Education  Discharge Discharge Education: Engorgement and breast care;Warning signs for feeding baby Personal DEBP  Consult Status Consult Status: Complete Date: 07/16/21    Dahlia Byes [facility] 07/16/2021, 8:32 AM

## 2021-07-18 ENCOUNTER — Encounter: Payer: Medicaid Other | Admitting: Obstetrics and Gynecology

## 2021-07-19 ENCOUNTER — Ambulatory Visit (INDEPENDENT_AMBULATORY_CARE_PROVIDER_SITE_OTHER): Payer: Medicaid Other

## 2021-07-19 ENCOUNTER — Other Ambulatory Visit: Payer: Self-pay

## 2021-07-19 DIAGNOSIS — Z3400 Encounter for supervision of normal first pregnancy, unspecified trimester: Secondary | ICD-10-CM

## 2021-07-19 MED ORDER — BLOOD PRESSURE KIT: PACK | 0 refills | Status: DC

## 2021-07-19 MED ORDER — BLOOD PRESSURE MONITOR DEVI: 0 refills | Status: DC

## 2021-07-19 MED ORDER — BLOOD PRESSURE KIT
PACK | 0 refills | Status: DC
Start: 2021-07-19 — End: 2021-07-19

## 2021-07-19 MED ORDER — BLOOD PRESSURE MONITOR AUTOMAT DEVI: 1.0000 | Freq: Every day | 0 refills | Status: DC

## 2021-07-19 NOTE — Progress Notes (Signed)
Subjective:  Kayla Potts is a 30 y.o. female here for BP check.   Hypertension ROS: taking medications as instructed, no medication side effects noted, no TIA's, no chest pain on exertion, no dyspnea on exertion, and no swelling of ankles.    Objective:  BP 126/80   Pulse 92   LMP 10/19/2020 (Exact Date)   Appearance alert, well appearing, and in no distress. General exam BP noted to be well controlled today in office.    Assessment:   Blood Pressure well controlled.   Plan:  Current treatment plan is effective, no change in therapy.Marland Kitchen

## 2021-07-24 ENCOUNTER — Telehealth (HOSPITAL_COMMUNITY): Payer: Self-pay | Admitting: *Deleted

## 2021-07-24 NOTE — Telephone Encounter (Signed)
Patient reported that she is experiencing some constipation. RN referred patient to her OB to discuss medication options. Patient verbalized understanding. Patient voiced no other questions or concerns regarding her own health. EPDS = 5. Patient reported that infant has had "diaper rash for a couple of days." Stated that she has been using "butt cream." RN instructed patient to call infant's pediatrician if there is no improvement with rash by tomorrow. Patient verbalized understanding. Patient also asked RN about umbilical cord care. RN reviewed care instructions for umbilical cord. Also reviewed signs of infection. Instructed patient to notify pediatrician if signs of infection are observed. Patient voiced understanding. Stated that infant has peds appointment scheduled for 08/02/21. Patient voiced no other questions or concerns regarding baby at this time. Patient reported infant sleeps in a crib or in a portable bassinet that goes in the patient's bed. Stated that infant sleeps on his back. RN reviewed ABCs of safe sleep - patient verbalized understanding. Patient requested RN email information on hospital's virtual postpartum classes and support groups. Email sent. Deforest Hoyles, RN, 07/24/21, [phone].

## 2021-07-25 ENCOUNTER — Encounter: Payer: Medicaid Other | Admitting: Obstetrics and Gynecology

## 2021-08-14 ENCOUNTER — Other Ambulatory Visit: Payer: Self-pay

## 2021-08-14 ENCOUNTER — Ambulatory Visit: Payer: Medicaid Other | Admitting: Certified Nurse Midwife

## 2021-08-14 DIAGNOSIS — O165 Unspecified maternal hypertension, complicating the puerperium: Secondary | ICD-10-CM | POA: Diagnosis not present

## 2021-08-14 DIAGNOSIS — O9081 Anemia of the puerperium: Secondary | ICD-10-CM

## 2021-08-14 MED ORDER — NIFEDIPINE ER OSMOTIC RELEASE 60 MG PO TB24: 60.0000 mg | ORAL_TABLET | Freq: Every day | ORAL | 1 refills | Status: DC

## 2021-08-14 NOTE — Progress Notes (Signed)
Post Partum Visit Note  Kayla Potts is a 30 y.o. G33P1011 female who presents for a postpartum visit. She is 4 weeks postpartum following a normal spontaneous vaginal delivery.  I have fully reviewed the prenatal and intrapartum course. The delivery was at [redacted]w[redacted]d gestational weeks.  Anesthesia: none. Postpartum course has been uncomplicated. Baby is doing well. Baby is feeding by both breast and bottle - Gerber Soothe Pro . Bleeding staining only. Bowel function is normal. Bladder function is normal. Patient is not sexually active. Contraception method is none. Postpartum depression screening: negative.  Upstream - 08/17/21 1127       Pregnancy Intention Screening   Does the patient want to become pregnant in the next year? No    Does the patient's partner want to become pregnant in the next year? No    Would the patient like to discuss contraceptive options today? Yes      Contraception Wrap Up   Current Method Abstinence;Female Condom;Female Condom    End Method Abstinence;Female Condom;Female Condom    Contraception Counseling Provided Yes            The pregnancy intention screening data noted above was reviewed. Potential methods of contraception were discussed. The patient elected to proceed with Abstinence; Female Condom; Female Condom.   Edinburgh Postnatal Depression Scale - 08/14/21 1608       Edinburgh Postnatal Depression Scale:  In the Past 7 Days   I have been able to laugh and see the funny side of things. 0    I have looked forward with enjoyment to things. 0    I have blamed myself unnecessarily when things went wrong. 2    I have been anxious or worried for no good reason. 1    I have felt scared or panicky for no good reason. 0    Things have been getting on top of me. 0    I have been so unhappy that I have had difficulty sleeping. 0    I have felt sad or miserable. 1    I have been so unhappy that I have been crying. 0    The thought of harming myself has  occurred to me. 0    Edinburgh Postnatal Depression Scale Total 4             Health Maintenance Due  Topic Date Due   COVID-19 Vaccine (1) Never done   The following portions of the patient's history were reviewed and updated as appropriate: allergies, current medications, past family history, past medical history, past social history, past surgical history, and problem list.  Review of Systems Pertinent items noted in HPI and remainder of comprehensive ROS otherwise negative.  Objective:  BP (!) 141/81 (BP Location: Right Arm, Patient Position: Sitting, Cuff Size: Normal)   Pulse 68   Temp 98.4 F (36.9 C) (Oral)   Ht 5\' 5"  (1.651 m)   Wt 159 lb (72.1 kg)   LMP 10/19/2020 (Exact Date)   Breastfeeding Yes   BMI 26.46 kg/m    General:  alert, cooperative, appears stated age, and no distress   Breasts:  normal  Lungs: Normal effort  Heart:  regular rate and rhythm  Abdomen: Soft, non-tender    Wound N/A  GU exam:  not indicated       Assessment:  Postpartum care and examination of lactating mother - postpartum hypertension - pt states her BP at home is normal 100s-110s/60s-70s, high today in office. Initially planned to  increase procardia to 60mg  daily, but after discussion agreed to refill at 30mg /day and extend for another month with a RN nurse check. If it persists, we will refer to family med for follow up - history of PPH - will redraw CBC today and manage accordingly  Essential components of care per ACOG recommendations:  1.  Mood and well being: Patient with negative depression screening today. Reviewed local resources for support.  - Patient tobacco use? No.   - hx of drug use? No.    2. Infant care and feeding:  -Patient currently breastmilk feeding? Yes. Reviewed importance of draining breast regularly to support lactation.  -Social determinants of health (SDOH) reviewed in EPIC. No concerns  3. Sexuality, contraception and birth spacing - Patient does  not want a pregnancy in the next year.  Desired family size is currently unknown. - Reviewed forms of contraception in tiered fashion. Patient desired abstinence, condoms today and is considering hormonal methods. Will follow up when ready to begin one. - Discussed birth spacing of 18 months  4. Sleep and fatigue -Encouraged family/partner/community support of 4 hrs of uninterrupted sleep to help with mood and fatigue  5. Physical Recovery  - Discussed patients delivery and complications. She describes her labor as good. - Patient had a Vaginal problems after delivery including postpartum hemorrhage due to uterine atony requiring 3 meds and a JADA placement . Patient had a 2nd degree laceration. Perineal healing reviewed. Patient expressed understanding - Patient has urinary incontinence? No. - Patient is safe to resume physical and sexual activity when ready  6.  Health Maintenance - HM due items addressed Yes - Last pap smear Feb 2022, normal with HRHPV+ (but negative for 16/18/45), needs follow up in 64yr (Feb 2023) -Breast Cancer screening indicated? No.   7. Chronic Disease/Pregnancy Condition follow up: Anemia and Hypertension - PCP follow up if needed  3yr, CNM [facility] for 05-18-2001, [facility]

## 2021-08-15 ENCOUNTER — Ambulatory Visit: Payer: Medicaid Other | Admitting: Obstetrics and Gynecology

## 2021-08-15 LAB — COMPREHENSIVE METABOLIC PANEL
ALT: 14 IU/L (ref 0–32)
AST: 22 IU/L (ref 0–40)
Albumin/Globulin Ratio: 1.9 (ref 1.2–2.2)
Albumin: 4.4 g/dL (ref 3.9–5.0)
Alkaline Phosphatase: 85 IU/L (ref 44–121)
BUN/Creatinine Ratio: 13 (ref 9–23)
BUN: 11 mg/dL (ref 6–20)
Bilirubin Total: 0.4 mg/dL (ref 0.0–1.2)
CO2: 20 mmol/L (ref 20–29)
Calcium: 9.3 mg/dL (ref 8.7–10.2)
Chloride: 104 mmol/L (ref 96–106)
Creatinine, Ser: 0.86 mg/dL (ref 0.57–1.00)
Globulin, Total: 2.3 g/dL (ref 1.5–4.5)
Glucose: 68 mg/dL — ABNORMAL LOW (ref 70–99)
Potassium: 4.2 mmol/L (ref 3.5–5.2)
Sodium: 141 mmol/L (ref 134–144)
Total Protein: 6.7 g/dL (ref 6.0–8.5)
eGFR: 94 mL/min/{1.73_m2} (ref >59)

## 2021-08-15 LAB — CBC
Hematocrit: 35 % (ref 34.0–46.6)
Hemoglobin: 11.3 g/dL (ref 11.1–15.9)
MCH: 30.5 pg (ref 26.6–33.0)
MCHC: 32.3 g/dL (ref 31.5–35.7)
MCV: 94 fL (ref 79–97)
Platelets: 190 10*3/uL (ref 150–450)
RBC: 3.71 x10E6/uL — ABNORMAL LOW (ref 3.77–5.28)
RDW: 12.4 % (ref 11.7–15.4)
WBC: 3.3 10*3/uL — ABNORMAL LOW (ref 3.4–10.8)

## 2021-08-16 ENCOUNTER — Other Ambulatory Visit: Payer: Self-pay

## 2021-08-16 DIAGNOSIS — O165 Unspecified maternal hypertension, complicating the puerperium: Secondary | ICD-10-CM

## 2021-08-16 MED ORDER — NIFEDIPINE ER 30 MG PO TB24
30.0000 mg | ORAL_TABLET | Freq: Every day | ORAL | 0 refills | Status: DC
Start: 2021-08-16 — End: 2023-01-30

## 2021-08-16 NOTE — Telephone Encounter (Signed)
Needs to extend her daily 30mg  procardia for another 30 days.

## 2021-08-16 NOTE — Telephone Encounter (Signed)
Patient called [facility] because she states that [place] CNM was to lower her dose of procardia and forgot to sent script to [facility] on [address].   Patient states she was previously on 30 mg. Will route to provider for approval Armandina Stammer RN

## 2021-08-26 ENCOUNTER — Other Ambulatory Visit: Payer: Self-pay

## 2021-08-26 ENCOUNTER — Telehealth (INDEPENDENT_AMBULATORY_CARE_PROVIDER_SITE_OTHER): Payer: Medicaid Other | Admitting: *Deleted

## 2021-08-26 VITALS — BP 138/87 | HR 72 | Wt 158.0 lb

## 2021-08-26 DIAGNOSIS — Z0130 Encounter for examination of blood pressure without abnormal findings: Secondary | ICD-10-CM

## 2021-08-26 NOTE — Progress Notes (Signed)
   Virtual Visit via Mychart  I connected with Kayla Potts on 08/26/21 at  3:00 PM EDT by telephone and verified that I am speaking with the correct person using two identifiers.  Location: Patient: home Provider: clinic   I discussed the limitations, risks, security and privacy concerns of performing an evaluation and management service by telephone and the availability of in person appointments. I also discussed with the patient that there may be a patient responsible charge related to this service. The patient expressed understanding and agreed to proceed.  Subjective:  Kayla Potts is a 30 y.o. female here for BP check.   Hypertension ROS: taking medications as instructed, no medication side effects noted, no TIA's, no chest pain on exertion, no dyspnea on exertion, no swelling of ankles, no orthostatic dizziness or lightheadedness, no orthopnea or paroxysmal nocturnal dyspnea, and no palpitations.    Objective:  There were no vitals taken for this visit.  Appearance alert, well appearing, and in no distress, oriented to person, place, and time, and normal appearing weight. General exam BP noted to be well controlled today in office.   Today's Vitals   08/26/21 1516  BP: 138/87  Pulse: 72  Weight: 158 lb (71.7 kg)   Body mass index is 26.29 kg/m.   Assessment:   Blood Pressure well controlled, stable, and asymptomatic.   Plan:  Current treatment plan is effective, no change in therapy.Marland Kitchen Appt scheduled for pelvic check per patient request. Patient stated she wanted to make sure everything is normal.  Follow Up Instructions:   I discussed the assessment and treatment plan with the patient. The patient was provided an opportunity to ask questions and all were answered. The patient agreed with the plan and demonstrated an understanding of the instructions.   The patient was advised to call back or seek an in-person evaluation if the symptoms worsen or if the condition  fails to improve as anticipated.  I provided 20 minutes of non-face-to-face time during this encounter.   Clovis Pu, RN

## 2021-08-27 ENCOUNTER — Ambulatory Visit: Payer: Medicaid Other

## 2021-08-28 ENCOUNTER — Other Ambulatory Visit: Payer: Self-pay

## 2021-08-28 ENCOUNTER — Ambulatory Visit (INDEPENDENT_AMBULATORY_CARE_PROVIDER_SITE_OTHER): Payer: Medicaid Other | Admitting: Certified Nurse Midwife

## 2021-08-28 VITALS — BP 114/69 | HR 106 | Temp 98.3°F | Wt 169.4 lb

## 2021-08-28 DIAGNOSIS — Z3009 Encounter for other general counseling and advice on contraception: Secondary | ICD-10-CM | POA: Diagnosis not present

## 2021-08-28 DIAGNOSIS — R102 Pelvic and perineal pain: Secondary | ICD-10-CM | POA: Diagnosis not present

## 2021-08-28 MED ORDER — SLYND 4 MG PO TABS: 1.0000 | ORAL_TABLET | Freq: Every day | ORAL | 0 refills | Status: DC

## 2021-08-28 MED ORDER — SLYND 4 MG PO TABS: 1.0000 | ORAL_TABLET | Freq: Every day | ORAL | 8 refills | Status: DC

## 2021-08-30 NOTE — Progress Notes (Signed)
History:  [name] is a 30 y.o. G2P1011 who presents to clinic today for physical assessment of vaginal healing. Was not ready at last appointment because she was still having some spotting. Has resumed intercourse and noted some mild tenderness in one area, just wants to make sure her healing is normal. Also wants to start on Slynd oral birth control.   The following portions of the patient's history were reviewed and updated as appropriate: allergies, current medications, family history, past medical history, social history, past surgical history and problem list.  Review of Systems:  Pertinent items noted in HPI and remainder of comprehensive ROS otherwise negative.    Objective:  Physical Exam BP 114/69 (BP Location: Left Arm, Patient Position: Sitting, Cuff Size: Normal)   Pulse (!) 106   Temp 98.3 F (36.8 C) (Oral)   Wt 169 lb 6.4 oz (76.8 kg)   Breastfeeding Yes   BMI 28.19 kg/m  Physical Exam Vitals and nursing note reviewed. Exam conducted with a chaperone present.  Constitutional:      Appearance: Normal appearance. She is normal weight.  HENT:     Head: Normocephalic and atraumatic.  Cardiovascular:     Rate and Rhythm: Normal rate and regular rhythm.     Pulses: Normal pulses.  Pulmonary:     Effort: Pulmonary effort is normal.  Abdominal:     Palpations: Abdomen is soft.  Genitourinary:    General: Normal vulva.    Musculoskeletal:        General: Normal range of motion.  Skin:    General: Skin is warm.     Capillary Refill: Capillary refill takes less than 2 seconds.  Neurological:     Mental Status: She is alert and oriented to person, place, and time.  Psychiatric:        Mood and Affect: Mood normal.        Behavior: Behavior normal.        Thought Content: Thought content normal.        Judgment: Judgment normal.   Labs and Imaging No results found for this or any previous visit (from the past 24 hour(s)).  No results  found.   Assessment & Plan:  1. Birth control counseling - Drospirenone (SLYND) 4 MG TABS; Take 1 tablet by mouth daily.  Dispense: 84 tablet; Refill: 0 - Drospirenone (SLYND) 4 MG TABS; Take 1 tablet by mouth daily.  Dispense: 28 tablet; Refill: 8  2. Vaginal pain - Appropriate healing noted, advised pt is just feeling underlying healing of tissue. Encouraged to use appropriate lubrication and vitamin E oil as area continues to heal itself. Pt expressed understanding.  Follow up in one year or PRN.  Bernerd Limbo, CNM 08/30/2021 10:40 AM

## 2022-11-17 NOTE — L&D Delivery Note (Signed)
OB/GYN Faculty Practice Delivery Note  Kayla Potts is a 32 y.o. G3P1011 s/p SVD at [redacted]w[redacted]d. She was admitted for IOL for PEC w/SF (BP).   ROM: 12h 94m with clear fluid GBS Status: Negative Maximum Maternal Temperature: 98.3  Labor Progress: IOL started last night with cytotec and FB. AROM then Pitocin and stalled at 7cm, then progressed quickly to 9.5cm with urge to push.  Delivery Date/Time: 08/14/23 at 1139am Delivery: Called to room and patient was complete and pushing. Due to history of large PPH, TXA started as patient started pushing. Head delivered LOA. No nuchal cord present. Shoulder and body delivered in usual fashion. Infant with spontaneous cry, placed on mother's lower abdomen (short umbilical cord), dried and stimulated. Cord clamped x 2 after 3-minute delay, and cut by FOB. Cord blood drawn. Placenta delivered spontaneously, intact, with 3-vessel cord. Fundus firm with massage and Pitocin. Labia, perineum, vagina, and cervix inspected, no laceration found.   Placenta: intact, given to parents for encapsulation Complications: none Lacerations: perineal abrasions only EBL: Analgesia: none  Postpartum Planning [x]  transfer orders to MB [x]  discharge summary started & shared [x]  follow-up scheduled  [x]  lists updated [x]  lasix and PP BabyScripts ordered  Infant: Boy(yes)  APGARs 8/9  weight pending  Edd Arbour, CNM, IBCLC Certified Nurse Midwife, [employer] for [employer], [facility] 08/14/2023, 12:35 PM

## 2023-01-14 LAB — PREGNANCY, URINE: Pregnancy Test, Urine: POSITIVE

## 2023-01-16 ENCOUNTER — Encounter: Payer: Self-pay | Admitting: *Deleted

## 2023-01-30 ENCOUNTER — Encounter (HOSPITAL_COMMUNITY): Payer: Self-pay | Admitting: Family Medicine

## 2023-01-30 ENCOUNTER — Inpatient Hospital Stay (HOSPITAL_COMMUNITY)
Admission: AD | Admit: 2023-01-30 | Discharge: 2023-01-30 | Discharge status: Against Medical Advice | Payer: BC Managed Care – PPO | Attending: Obstetrics and Gynecology | Admitting: Obstetrics and Gynecology

## 2023-01-30 ENCOUNTER — Encounter: Payer: Self-pay | Admitting: Obstetrics and Gynecology

## 2023-01-30 ENCOUNTER — Inpatient Hospital Stay (HOSPITAL_COMMUNITY): Payer: BC Managed Care – PPO

## 2023-01-30 DIAGNOSIS — Z3A10 10 weeks gestation of pregnancy: Secondary | ICD-10-CM

## 2023-01-30 DIAGNOSIS — O26851 Spotting complicating pregnancy, first trimester: Secondary | ICD-10-CM

## 2023-01-30 DIAGNOSIS — Z5329 Procedure and treatment not carried out because of patient's decision for other reasons: Secondary | ICD-10-CM | POA: Diagnosis not present

## 2023-01-30 DIAGNOSIS — O219 Vomiting of pregnancy, unspecified: Secondary | ICD-10-CM

## 2023-01-30 DIAGNOSIS — R109 Unspecified abdominal pain: Secondary | ICD-10-CM | POA: Diagnosis not present

## 2023-01-30 DIAGNOSIS — O26891 Other specified pregnancy related conditions, first trimester: Secondary | ICD-10-CM | POA: Diagnosis not present

## 2023-01-30 DIAGNOSIS — R1030 Lower abdominal pain, unspecified: Secondary | ICD-10-CM | POA: Insufficient documentation

## 2023-01-30 DIAGNOSIS — O418X10 Other specified disorders of amniotic fluid and membranes, first trimester, not applicable or unspecified: Secondary | ICD-10-CM

## 2023-01-30 DIAGNOSIS — O208 Other hemorrhage in early pregnancy: Secondary | ICD-10-CM | POA: Insufficient documentation

## 2023-01-30 DIAGNOSIS — O26899 Other specified pregnancy related conditions, unspecified trimester: Secondary | ICD-10-CM

## 2023-01-30 LAB — WET PREP, GENITAL
Clue Cells Wet Prep HPF POC: NONE SEEN
Sperm: NONE SEEN
Trich, Wet Prep: NONE SEEN
WBC, Wet Prep HPF POC: >=10 — AB (ref <10)
Yeast Wet Prep HPF POC: NONE SEEN

## 2023-01-30 LAB — CBC
HCT: 36.6 % (ref 36.0–46.0)
Hemoglobin: 12.8 g/dL (ref 12.0–15.0)
MCH: 31.4 pg (ref 26.0–34.0)
MCHC: 35 g/dL (ref 30.0–36.0)
MCV: 89.9 fL (ref 80.0–100.0)
Platelets: 220 10*3/uL (ref 150–400)
RBC: 4.07 MIL/uL (ref 3.87–5.11)
RDW: 13.2 % (ref 11.5–15.5)
WBC: 8.2 10*3/uL (ref 4.0–10.5)
nRBC: 0 % (ref 0.0–0.2)

## 2023-01-30 LAB — HIV ANTIBODY (ROUTINE TESTING W REFLEX): HIV Screen 4th Generation wRfx: NONREACTIVE

## 2023-01-30 LAB — HCG, QUANTITATIVE, PREGNANCY: hCG, Beta Chain, Quant, S: 208680 m[IU]/mL — ABNORMAL HIGH (ref <5)

## 2023-01-30 NOTE — MAU Note (Addendum)
...  Kayla Potts is a 32 y.o. at [redacted]w[redacted]d here in MAU reporting: Occasional light pink vaginal bleeding for the past two weeks. She reports the bleeding has always been very light and she only sees the blood when she wipes. Has not seen any blood today, last saw light pink blood last night. Denies blood clots. Denies recent IC. Denies vaginal itching but reports her discharge smells like an egg and is white and creamy. Endorses occasional lower abdominal cramping.  Onset of complaint: x2 weeks Pain score: Denies pain currently.  FHT: 174 doppler - heart tones found higher than expected gestation Lab orders placed from triage:  UA

## 2023-01-30 NOTE — MAU Note (Signed)
Pt not in lobby. Registration reports the pt told them she was leaving and walked out. Pt states she needed to go back to work and she wasn't waiting. Provider notified

## 2023-01-30 NOTE — MAU Provider Note (Signed)
History     CSN: [id number]  Arrival date and time: 01/30/23 1004   Event Date/Time   First Provider Initiated Contact with Patient 01/30/23 1109      Chief Complaint  Patient presents with   Vaginal Bleeding   [name] is a 32 y.o. year old G60P1011 female at [redacted]w[redacted]d weeks gestation who presents to MAU reporting occasional light pink vaginal bleeding x 2 weeks. She reports the light VB with wiping. The last time she had the VB was last night; none today. She also reports her vaginal discharge "smells like egg whites or when you ovulate" and is creamy. She reports occasional lower abdominal cramping. She plans to receive [facility] with [facility] Samella Parr, CNM); next appt is 02/25/2023.   OB History     Gravida  3   Para  1   Term  1   Preterm      AB  1   Living  1      SAB  1   IAB      Ectopic      Multiple  0   Live Births  1           Past Medical History:  Diagnosis Date   Indication for care in labor or delivery 07/11/2021   Medical history non-contributory    Pregnancy of unknown anatomic location 11/02/2018    Past Surgical History:  Procedure Laterality Date   NO PAST SURGERIES      History reviewed. No pertinent family history.  Social History   Tobacco Use   Smoking status: Never   Smokeless tobacco: Never  Vaping Use   Vaping Use: Never used  Substance Use Topics   Alcohol use: Not Currently   Drug use: Never    Allergies: No Known Allergies  Medications Prior to Admission  Medication Sig Dispense Refill Last Dose   Blood Pressure Monitor DEVI Please check blood pressure 1 each 0    Drospirenone (SLYND) 4 MG TABS Take 1 tablet by mouth daily. 84 tablet 0    Drospirenone (SLYND) 4 MG TABS Take 1 tablet by mouth daily. 28 tablet 8    Misc. Devices (GOJJI WEIGHT SCALE) MISC 1 Device by Does not apply route daily as needed. To weight self daily as needed at home. ICD-10 code: Z34.90 1 each 0    NIFEdipine (ADALAT CC) 30 MG 24 hr  tablet Take 1 tablet (30 mg total) by mouth daily. 30 tablet 0    Prenatal 27-1 MG TABS Take 1 tablet by mouth daily.       Review of Systems  Constitutional: Negative.   HENT: Negative.    Eyes: Negative.   Respiratory: Negative.    Cardiovascular: Negative.   Gastrointestinal: Negative.   Endocrine: Negative.   Genitourinary:  Positive for pelvic pain (lower cramping), vaginal bleeding and vaginal discharge.  Musculoskeletal: Negative.   Skin: Negative.   Allergic/Immunologic: Negative.   Neurological: Negative.   Hematological: Negative.   Psychiatric/Behavioral: Negative.     Physical Exam   Blood pressure 125/72, pulse 79, temperature 98.4 F (36.9 C), temperature source Oral, resp. rate 17, height 5\' 5"  (1.651 m), weight 84.6 kg, last menstrual period 11/20/2022, SpO2 100 %, currently breastfeeding.  Physical Exam Vitals and nursing note reviewed.  Constitutional:      Appearance: Normal appearance. She is normal weight.  Pulmonary:     Effort: Pulmonary effort is normal.  Genitourinary:    Comments: Swabs collected by self-swab Musculoskeletal:  General: Normal range of motion.  Neurological:     Mental Status: She is alert and oriented to person, place, and time.  Psychiatric:        Mood and Affect: Mood normal.        Behavior: Behavior normal.        Thought Content: Thought content normal.        Judgment: Judgment normal.     MAU Course  Procedures  MDM CCUA UPT CBC ABO/Rh HCG Wet Prep GC/CT -- pending HIV -- pending OB < 14 wks [place] with TV  Results for orders placed or performed during the hospital encounter of 01/30/23 (from the past 24 hour(s))  Wet prep, genital     Status: Abnormal   Collection Time: 01/30/23 11:08 AM   Specimen: Vaginal  Result Value Ref Range   Yeast Wet Prep HPF POC NONE SEEN NONE SEEN   Trich, Wet Prep NONE SEEN NONE SEEN   Clue Cells Wet Prep HPF POC NONE SEEN NONE SEEN   WBC, Wet Prep HPF POC >=10 (A) <10    Sperm NONE SEEN   CBC     Status: None   Collection Time: 01/30/23 11:20 AM  Result Value Ref Range   WBC 8.2 4.0 - 10.5 K/uL   RBC 4.07 3.87 - 5.11 MIL/uL   Hemoglobin 12.8 12.0 - 15.0 g/dL   HCT 36.6 36.0 - 46.0 %   MCV 89.9 80.0 - 100.0 fL   MCH 31.4 26.0 - 34.0 pg   MCHC 35.0 30.0 - 36.0 g/dL   RDW 13.2 11.5 - 15.5 %   Platelets 220 150 - 400 K/uL   nRBC 0.0 0.0 - 0.2 %  hCG, quantitative, pregnancy     Status: Abnormal   Collection Time: 01/30/23 11:20 AM  Result Value Ref Range   hCG, Beta Chain, Quant, S 208,680 (H) <5 mIU/mL  HIV Antibody (routine testing w rflx)     Status: None   Collection Time: 01/30/23 11:20 AM  Result Value Ref Range   HIV Screen 4th Generation wRfx Non Reactive Non Reactive    US OB Comp Less 14 Wks Result Date: 01/30/2023 CLINICAL DATA:  Spotting affecting pregnancy in first trimester. EXAM: OBSTETRIC <14 WK [place] AND TRANSVAGINAL OB US TECHNIQUE: Both transabdominal and transvaginal ultrasound examinations were performed for complete evaluation of the gestation as well as the maternal uterus, adnexal regions, and pelvic cul-de-sac. Transvaginal technique was performed to assess early pregnancy. COMPARISON:  None Available. FINDINGS: Intrauterine gestational sac: Single Yolk sac:  Visualized. Embryo:  Visualized. Cardiac Activity: Visualized. Heart Rate: 176 bpm CRL:  27.2 mm   9 w   4 d                  [place] EDC: 08/31/2023 Subchorionic hemorrhage: Small subchorionic hemorrhage measuring 1.1 x 0.6 x 0.7 cm Maternal uterus/adnexae: Right ovary measures 3.3 x 1.4 x 2.2 cm and the left ovary measures 2.6 x 2.4 x 2.0 cm. IMPRESSION: 1. Single live intrauterine gestation with crown-rump length of 27.2 mm corresponding to gestational age of [redacted] weeks and 4 days, EDC based on today's sonogram is 08/31/2023. 2. Small subchorionic hemorrhage measuring up to 1.1 cm. 3. Ovaries are unremarkable. Electronically Signed   By: Keane Police D.O.   On: 01/30/2023 12:22      Assessment and Plan  1. Abdominal pain affecting pregnancy 2. Spotting affecting pregnancy in first trimester 3. Subchorionic hematoma in first trimester, single or unspecified  fetus 4. [redacted] weeks gestation of pregnancy - Patient left AMA reporting she had to get to work and that she had her child with her  Laury Deep, [place] 01/30/2023, 11:09 AM

## 2023-02-02 LAB — GC/CHLAMYDIA PROBE AMP (~~LOC~~) NOT AT ARMC
Chlamydia: NEGATIVE
Comment: NEGATIVE
Comment: NORMAL
Neisseria Gonorrhea: NEGATIVE

## 2023-02-02 MED ORDER — PRENATAL 27-1 MG PO TABS: 1.0000 | ORAL_TABLET | Freq: Every day | ORAL | 11 refills | Status: DC

## 2023-02-02 MED ORDER — PROMETHAZINE HCL 25 MG PO TABS: 25.0000 mg | ORAL_TABLET | Freq: Four times a day (QID) | ORAL | 0 refills | Status: DC | PRN

## 2023-02-09 ENCOUNTER — Telehealth: Payer: Self-pay | Admitting: Family Medicine

## 2023-02-09 MED ORDER — ONDANSETRON HCL 4 MG PO TABS: 4.0000 mg | ORAL_TABLET | Freq: Three times a day (TID) | ORAL | 0 refills | Status: DC | PRN

## 2023-02-09 NOTE — Telephone Encounter (Signed)
Pt reports that she would like Zofran instead of phenergan as it makes her dizzy.  Zofran e-prescribed per Dr. Rip Harbour.  Kayla Potts

## 2023-02-09 NOTE — Telephone Encounter (Signed)
Patient has been calling to get medication changed but says no one has called her back.... would like a nurse to call back.

## 2023-02-17 ENCOUNTER — Telehealth (INDEPENDENT_AMBULATORY_CARE_PROVIDER_SITE_OTHER): Payer: BC Managed Care – PPO

## 2023-02-17 DIAGNOSIS — Z3491 Encounter for supervision of normal pregnancy, unspecified, first trimester: Secondary | ICD-10-CM

## 2023-02-17 DIAGNOSIS — Z3689 Encounter for other specified antenatal screening: Secondary | ICD-10-CM

## 2023-02-17 DIAGNOSIS — Z3490 Encounter for supervision of normal pregnancy, unspecified, unspecified trimester: Secondary | ICD-10-CM | POA: Insufficient documentation

## 2023-02-17 DIAGNOSIS — O219 Vomiting of pregnancy, unspecified: Secondary | ICD-10-CM

## 2023-02-17 MED ORDER — ONDANSETRON 8 MG PO TBDP: 8.0000 mg | ORAL_TABLET | Freq: Three times a day (TID) | ORAL | 0 refills | Status: DC | PRN

## 2023-02-17 NOTE — Patient Instructions (Signed)

## 2023-02-17 NOTE — Progress Notes (Signed)
New OB Intake  I connected with Kayla Potts  on 02/17/23 at  2:15 PM EDT by MyChart Video Visit and verified that I am speaking with the correct person using two identifiers. Nurse is located at [facility] and pt is located at home.  I discussed the limitations, risks, security and privacy concerns of performing an evaluation and management service by telephone and the availability of in person appointments. I also discussed with the patient that there may be a patient responsible charge related to this service. The patient expressed understanding and agreed to proceed.  I explained I am completing New OB Intake today. We discussed EDD of 08/27/2023 that is based on LMP of 11/20/2022. Pt is G3/P1. I reviewed her allergies, medications, Medical/Surgical/OB history, and appropriate screenings. I informed her of [facility] services. [facility] information placed in AVS. Based on history, this is a low risk pregnancy.  There are no problems to display for this patient.   Concerns addressed today  Delivery Plans Plans to deliver at [facility]. Patient given information for [facility] Healthy Baby website for more information about Women's and [employer].   MyChart/Babyscripts MyChart access verified. I explained pt will have some visits in office and some virtually. Babyscripts instructions given and order placed. Patient verifies receipt of registration text/e-mail. Account successfully created and app downloaded.  Blood Pressure Cuff/Weight Scale Explained after first prenatal appt pt will check weekly and document in 65.   Anatomy US Explained first scheduled [place] will be around 19 weeks. Anatomy US scheduled for 04/06/2023 at 1:45pm. Pt notified to arrive at 1:30pm.  Labs Discussed [name] genetic screening with patient. Considering both Panorama and Horizon drawn at new OB visit. Routine prenatal labs needed.  COVID Vaccine Patient has not had COVID vaccine.    Is patient a Mom+Baby Combined Care  candidate?  Not a candidate   If accepted, Mom+Baby staff notified  Social Determinants of Health Food Insecurity: Patient denies food insecurity. WIC Referral: Patient is interested in referral to [facility].  Transportation: Patient denies transportation needs. Childcare: Discussed no children allowed at ultrasound appointments. Offered childcare services; patient declines childcare services at this time.  Interested in Ong? If yes, send referral and doula dot phrase.   First visit review I reviewed new OB appt with patient. I explained they will have a provider visit that includes any initial labs needed and listening to baby heartbeat. Explained pt will be seen by Gaylan Gerold, CNM at first visit; encounter routed to appropriate provider. Explained that patient will be seen by pregnancy navigator following visit with provider.   Caryl Ada, CMA 02/17/2023  1:59 PM

## 2023-02-25 ENCOUNTER — Other Ambulatory Visit: Payer: Self-pay

## 2023-02-25 ENCOUNTER — Ambulatory Visit: Payer: BC Managed Care – PPO | Admitting: Certified Nurse Midwife

## 2023-02-25 VITALS — BP 126/71 | HR 89 | Ht 65.0 in | Wt 193.4 lb

## 2023-02-25 DIAGNOSIS — Z3A13 13 weeks gestation of pregnancy: Secondary | ICD-10-CM | POA: Diagnosis not present

## 2023-02-25 DIAGNOSIS — Z3491 Encounter for supervision of normal pregnancy, unspecified, first trimester: Secondary | ICD-10-CM

## 2023-02-25 DIAGNOSIS — O219 Vomiting of pregnancy, unspecified: Secondary | ICD-10-CM

## 2023-02-25 DIAGNOSIS — Z8759 Personal history of other complications of pregnancy, childbirth and the puerperium: Secondary | ICD-10-CM

## 2023-02-26 DIAGNOSIS — Z8759 Personal history of other complications of pregnancy, childbirth and the puerperium: Secondary | ICD-10-CM

## 2023-02-26 HISTORY — DX: Personal history of other complications of pregnancy, childbirth and the puerperium: Z87.59

## 2023-02-26 LAB — CBC/D/PLT+RPR+RH+ABO+RUBIGG...
Antibody Screen: NEGATIVE
Basophils Absolute: 0 10*3/uL (ref 0.0–0.2)
Basos: 1 %
EOS (ABSOLUTE): 0.3 10*3/uL (ref 0.0–0.4)
Eos: 4 %
HCV Ab: NONREACTIVE
HIV Screen 4th Generation wRfx: NONREACTIVE
Hematocrit: 39.4 % (ref 34.0–46.6)
Hemoglobin: 13 g/dL (ref 11.1–15.9)
Hepatitis B Surface Ag: NEGATIVE
Immature Grans (Abs): 0 10*3/uL (ref 0.0–0.1)
Immature Granulocytes: 1 %
Lymphocytes Absolute: 1.4 10*3/uL (ref 0.7–3.1)
Lymphs: 17 %
MCH: 30.6 pg (ref 26.6–33.0)
MCHC: 33 g/dL (ref 31.5–35.7)
MCV: 93 fL (ref 79–97)
Monocytes Absolute: 0.4 10*3/uL (ref 0.1–0.9)
Monocytes: 5 %
Neutrophils Absolute: 5.9 10*3/uL (ref 1.4–7.0)
Neutrophils: 72 %
Platelets: 214 10*3/uL (ref 150–450)
RBC: 4.25 x10E6/uL (ref 3.77–5.28)
RDW: 13.1 % (ref 11.7–15.4)
RPR Ser Ql: NONREACTIVE
Rh Factor: POSITIVE
Rubella Antibodies, IGG: 5.65 index (ref >0.99)
WBC: 8.1 10*3/uL (ref 3.4–10.8)

## 2023-02-26 LAB — COMPREHENSIVE METABOLIC PANEL
ALT: 17 IU/L (ref 0–32)
AST: 21 IU/L (ref 0–40)
Albumin/Globulin Ratio: 1.5 (ref 1.2–2.2)
Albumin: 4.1 g/dL (ref 4.0–5.0)
Alkaline Phosphatase: 51 IU/L (ref 44–121)
BUN/Creatinine Ratio: 16 (ref 9–23)
BUN: 10 mg/dL (ref 6–20)
Bilirubin Total: 0.6 mg/dL (ref 0.0–1.2)
CO2: 19 mmol/L — ABNORMAL LOW (ref 20–29)
Calcium: 9.7 mg/dL (ref 8.7–10.2)
Chloride: 104 mmol/L (ref 96–106)
Creatinine, Ser: 0.63 mg/dL (ref 0.57–1.00)
Globulin, Total: 2.7 g/dL (ref 1.5–4.5)
Glucose: 89 mg/dL (ref 70–99)
Potassium: 4.2 mmol/L (ref 3.5–5.2)
Sodium: 138 mmol/L (ref 134–144)
Total Protein: 6.8 g/dL (ref 6.0–8.5)
eGFR: 122 mL/min/{1.73_m2} (ref >59)

## 2023-02-26 LAB — PROTEIN / CREATININE RATIO, URINE
Creatinine, Urine: 248.1 mg/dL
Protein, Ur: 16.8 mg/dL
Protein/Creat Ratio: 68 mg/g creat (ref 0–200)

## 2023-02-26 LAB — HCV INTERPRETATION

## 2023-02-26 LAB — HEMOGLOBIN A1C
Est. average glucose Bld gHb Est-mCnc: 108 mg/dL
Hgb A1c MFr Bld: 5.4 % (ref 4.8–5.6)

## 2023-02-26 MED ORDER — DOXYLAMINE-PYRIDOXINE 10-10 MG PO TBEC: 1.0000 | DELAYED_RELEASE_TABLET | Freq: Every day | ORAL | 3 refills | Status: DC

## 2023-02-26 MED ORDER — ONDANSETRON 8 MG PO TBDP: 8.0000 mg | ORAL_TABLET | Freq: Three times a day (TID) | ORAL | 0 refills | Status: DC | PRN

## 2023-02-26 MED ORDER — ASPIRIN 81 MG PO TBEC: 81.0000 mg | DELAYED_RELEASE_TABLET | Freq: Every day | ORAL | 12 refills | Status: DC

## 2023-02-26 NOTE — Progress Notes (Signed)
History:   Kayla Potts is a 32 y.o. G3P1011 at [redacted]w[redacted]d by LMP being seen today for her first obstetrical visit.  Her obstetrical history is significant for pre-eclampsia and immediate postpartum hemorrhage . Patient does intend to breast feed. Pregnancy history fully reviewed.  Patient reports nausea with vomiting, zofran is working but she is having some constipation.   HISTORY: OB History  Gravida Para Term Preterm AB Living  3 1 1  0 1 1  SAB IAB Ectopic Multiple Live Births  1 0 0 0 1    # Outcome Date GA Lbr Len/2nd Weight Sex Delivery Anes PTL Lv  3 Current           2 Term 07/13/21 [redacted]w[redacted]d / 01:59 7 lb 3 oz (3.26 kg) F Vag-Spont None  LIV     Name: Galbraith,GIRL Eveline     Apgar1: 9  Apgar5: 9  1 SAB 2019            Last pap smear was done 2022 and was abnormal - NILM with HRHPV positive . Needs a repeat pap but will complete at [facility] visit.  Past Medical History:  Diagnosis Date   Cervical high risk human papillomavirus (HPV) DNA test positive 01/18/2021   Formatting of this note might be different from the original. 01/10/21 NILM, HR HPV positive (negative for 16, 18, 45) Denies hx of abnormal paps. Patient counseled on follow up plan Plan Repeat pap smear postpartum visit Formatting of this note might be different from the original. 01/10/21 NILM, HR HPV positive (negative for 16, 18, 45) Denies hx of abnormal paps. Patient counseled on follow up pl   Encounter for other specified antenatal screening 03/07/2021   Formatting of this note might be different from the original. Diagnosis necessary for prenatal screening labs. Formatting of this note might be different from the original. Diagnosis necessary for prenatal screening labs.   Indication for care in labor or delivery 07/11/2021   Medical history non-contributory    Pregnancy 01/21/2021   Formatting of this note might be different from the original. Dating by LMP c/w 8 week [place]. Estimated Date of Delivery: 07/26/21 NT and NIPS  wnl, female (gender reveal planned, pt unaware of sex) 20wk anatomic scan- normal, fundal placenta, incomplete profile views. Repeat at 24wks. Formatting of this note might be different from the original. Dating by LMP c/w 8 week [place]. Estimated Date of Delivery:    Pregnancy of unknown anatomic location 11/02/2018   Past Surgical History:  Procedure Laterality Date   NO PAST SURGERIES     Family History  Problem Relation Age of Onset   Healthy Mother    Irregular heart beat Father    Social History   Tobacco Use   Smoking status: Never   Smokeless tobacco: Never  Vaping Use   Vaping Use: Never used  Substance Use Topics   Alcohol use: Not Currently   Drug use: Never   No Known Allergies Current Outpatient Medications on File Prior to Visit  Medication Sig Dispense Refill   Blood Pressure Monitor DEVI Please check blood pressure 1 each 0   Misc. Devices (GOJJI WEIGHT SCALE) MISC 1 Device by Does not apply route daily as needed. To weight self daily as needed at home. ICD-10 code: Z34.90 1 each 0   Prenatal 27-1 MG TABS Take 1 tablet by mouth daily. 30 tablet 11   ondansetron (ZOFRAN) 4 MG tablet Take 1 tablet (4 mg total) by mouth every 8 (  eight) hours as needed for nausea or vomiting. (Patient not taking: Reported on 02/25/2023) 20 tablet 0   promethazine (PHENERGAN) 25 MG tablet Take 1 tablet (25 mg total) by mouth every 6 (six) hours as needed for nausea or vomiting. (Patient not taking: Reported on 02/17/2023) 30 tablet 0   No current facility-administered medications on file prior to visit.   Review of Systems Pertinent items noted in HPI and remainder of comprehensive ROS otherwise negative.  Physical Exam:   Vitals:   02/25/23 1047  BP: 126/71  Pulse: 89  Weight: 193 lb 6.4 oz (87.7 kg)   Fetal Heart Rate (bpm): 160  Constitutional: Well-developed, well-nourished pregnant female in no acute distress.  HEENT: PERRLA Skin: normal color and turgor, no  rash Cardiovascular: normal rate & rhythm, warm and well perfused Respiratory: normal effort, no problems with respiration noted GI: Abd soft, non-tender, gravid appropriate for gestational age MS: Extremities nontender, no edema, normal ROM Neurologic: Alert and oriented x 4.  GU: no CVA tenderness Pelvic: exam deferred  Assessment:   Pregnancy: G3P1011 Patient Active Problem List   Diagnosis Date Noted   History of postpartum hemorrhage 02/26/2023   History of pre-eclampsia 02/25/2023   Supervision of low-risk pregnancy 02/17/2023   Plan:   1. Encounter for supervision of low-risk pregnancy in first trimester - Doing well overall, excited about this pregnancy - CBC/D/Plt+RPR+Rh+ABO+RubIgG... - HgB A1c - Culture, OB Urine - Horizon 2 (CF & SMA) - Panorama Prenatal Test Full Panel  2. [redacted] weeks gestation of pregnancy - Routine OB care, will offer AFP at next visit  3. Nausea/vomiting in pregnancy - Diclegis prescribed, also counseled on constipation management - ondansetron (ZOFRAN-ODT) 8 MG disintegrating tablet; Take 1 tablet (8 mg total) by mouth every 8 (eight) hours as needed for nausea or vomiting.  Dispense: 20 tablet; Refill: 0  4. History of pre-eclampsia - aspirin EC 81mg  tablet - Comp Met (CMET) - Protein / creatinine ratio, urine -  5. Initial obstetric visit in first trimester - Initial labs drawn. - Continue prenatal vitamins. - Problem list reviewed and updated. - Genetic Screening discussed, First trimester screen, Quad screen, and NIPS: ordered. - Ultrasound discussed; fetal anatomic survey: ordered. - Anticipatory guidance about prenatal visits given including labs, ultrasounds, and testing. - Discussed usage of Babyscripts and virtual visits as additional source of managing and completing prenatal visits in midst of coronavirus and pandemic.   - Encouraged to complete MyChart Registration for her ability to review results, send requests, and have  questions addressed.  - The nature of [facility] with multiple MDs and Advanced Practice Providers was explained to patient; also emphasized that residents, students are part of our team. - Pt prefers CNM care, will remain on CNM schedule. - Routine obstetric precautions reviewed. Encouraged to seek out care at office or emergency room [facility] preferred) for urgent and/or emergent concerns.  Return in about 4 weeks (around 03/25/2023) for IN-PERSON, LOB.     Edd Arbour, MSN, CNM, IBCLC Certified Nurse Midwife, [facility]

## 2023-02-27 LAB — URINE CULTURE, OB REFLEX: Organism ID, Bacteria: NO GROWTH

## 2023-02-27 LAB — CULTURE, OB URINE

## 2023-03-04 ENCOUNTER — Telehealth: Payer: Self-pay | Admitting: Lactation Services

## 2023-03-04 ENCOUNTER — Encounter: Payer: Self-pay | Admitting: Certified Nurse Midwife

## 2023-03-04 LAB — HORIZON 2 (CF & SMA)
CYSTIC FIBROSIS: NEGATIVE
REPORT SUMMARY: NEGATIVE
SPINAL MUSCULAR ATROPHY: NEGATIVE

## 2023-03-04 LAB — PANORAMA PRENATAL TEST FULL PANEL:PANORAMA TEST PLUS 5 ADDITIONAL MICRODELETIONS: FETAL FRACTION: 11.6

## 2023-03-04 NOTE — Telephone Encounter (Signed)
Called and spoke with patient. She reports that she would like to speak with Harvin Hazel, CNM. She reports she and her partner were reading her results and found a + HPV result on results for 01/16/2021. Patient reports she was never informed of + results and is very concerned. She reports she has only had one partner and is very concerned with + results. She is requesting that Edd Arbour given her a call to discuss ASAP.

## 2023-03-08 NOTE — Telephone Encounter (Signed)
Called back same day to discuss testing results - pt does not remember ever being told that she had HPV and she/her partner are very confused/upset at how she got it. Able to speak to both of them to reassure them of how common HPV is, how it can remain dormant and only show up on pap testing, is usually asymptomatic in men etc. Discouraged them from casting blame on each other since this was positive 70yrs ago and they'd both had possible exposures prior to dating each other. Both expressed understanding and relief. Encouraged to let me know if they have additional questions/concerns.   Edd Arbour, CNM, MSN, IBCLC Certified Nurse Midwife, [facility]

## 2023-03-25 ENCOUNTER — Other Ambulatory Visit: Payer: Self-pay

## 2023-03-25 ENCOUNTER — Ambulatory Visit (INDEPENDENT_AMBULATORY_CARE_PROVIDER_SITE_OTHER): Payer: BC Managed Care – PPO | Admitting: Certified Nurse Midwife

## 2023-03-25 ENCOUNTER — Encounter: Payer: BC Managed Care – PPO | Admitting: Certified Nurse Midwife

## 2023-03-25 VITALS — BP 131/79 | HR 92 | Wt 202.4 lb

## 2023-03-25 DIAGNOSIS — Z3492 Encounter for supervision of normal pregnancy, unspecified, second trimester: Secondary | ICD-10-CM

## 2023-03-25 DIAGNOSIS — Z3A17 17 weeks gestation of pregnancy: Secondary | ICD-10-CM

## 2023-03-25 NOTE — Progress Notes (Signed)
   PRENATAL VISIT NOTE  Subjective:  Kayla Potts is a 32 y.o. G3P1011 at [redacted]w[redacted]d being seen today for ongoing prenatal care.  She is currently monitored for the following issues for this low-risk pregnancy and has Supervision of low-risk pregnancy; History of pre-eclampsia; and History of postpartum hemorrhage on their problem list.  Patient reports no complaints.  Contractions: Not present. Vag. Bleeding: None.  Movement: Present. Denies leaking of fluid.   The following portions of the patient's history were reviewed and updated as appropriate: allergies, current medications, past family history, past medical history, past social history, past surgical history and problem list.   Objective:   Vitals:   03/25/23 1449  BP: 131/79  Pulse: 92  Weight: 202 lb 6.4 oz (91.8 kg)    Fetal Status: Fetal Heart Rate (bpm): 150   Movement: Present     General:  Alert, oriented and cooperative. Patient is in no acute distress.  Skin: Skin is warm and dry. No rash noted.   Cardiovascular: Normal heart rate noted  Respiratory: Normal respiratory effort, no problems with respiration noted  Abdomen: Soft, gravid, appropriate for gestational age.  Pain/Pressure: Absent     Pelvic: Cervical exam deferred        Extremities: Normal range of motion.  Edema: None  Mental Status: Normal mood and affect. Normal behavior. Normal judgment and thought content.   Assessment and Plan:  Pregnancy: G3P1011 at [redacted]w[redacted]d 1. Encounter for supervision of low-risk pregnancy in second trimester - Doing well, feeling regular fetal flutters - Ambulatory Referral to Doula Services  2. [redacted] weeks gestation of pregnancy - Routine OB care   Preterm labor symptoms and general obstetric precautions including but not limited to vaginal bleeding, contractions, leaking of fluid and fetal movement were reviewed in detail with the patient. Please refer to After Visit Summary for other counseling recommendations.   Return in  about 4 weeks (around 04/22/2023) for IN-PERSON, LOB.  Future Appointments  Date Time Provider Department Center  04/06/2023  1:45 PM [facility] US5 [facility]  04/22/2023  1:55 PM Bernerd Limbo, CNM [facility]    Bernerd Limbo, CNM

## 2023-04-03 ENCOUNTER — Encounter: Payer: Self-pay | Admitting: *Deleted

## 2023-04-06 ENCOUNTER — Ambulatory Visit: Payer: BC Managed Care – PPO | Attending: Certified Nurse Midwife

## 2023-04-06 ENCOUNTER — Other Ambulatory Visit: Payer: Self-pay

## 2023-04-06 DIAGNOSIS — Z3A19 19 weeks gestation of pregnancy: Secondary | ICD-10-CM | POA: Insufficient documentation

## 2023-04-06 DIAGNOSIS — Z8759 Personal history of other complications of pregnancy, childbirth and the puerperium: Secondary | ICD-10-CM

## 2023-04-06 DIAGNOSIS — O09292 Supervision of pregnancy with other poor reproductive or obstetric history, second trimester: Secondary | ICD-10-CM | POA: Insufficient documentation

## 2023-04-06 DIAGNOSIS — Z3491 Encounter for supervision of normal pregnancy, unspecified, first trimester: Secondary | ICD-10-CM

## 2023-04-06 DIAGNOSIS — Z363 Encounter for antenatal screening for malformations: Secondary | ICD-10-CM | POA: Diagnosis present

## 2023-04-06 DIAGNOSIS — Z362 Encounter for other antenatal screening follow-up: Secondary | ICD-10-CM

## 2023-04-22 ENCOUNTER — Ambulatory Visit (INDEPENDENT_AMBULATORY_CARE_PROVIDER_SITE_OTHER): Payer: BC Managed Care – PPO | Admitting: Certified Nurse Midwife

## 2023-04-22 ENCOUNTER — Other Ambulatory Visit: Payer: Self-pay

## 2023-04-22 ENCOUNTER — Other Ambulatory Visit (HOSPITAL_COMMUNITY)
Admission: RE | Admit: 2023-04-22 | Discharge: 2023-04-22 | Disposition: A | Discharge status: Home / Self Care | Payer: BC Managed Care – PPO | Source: Ambulatory Visit | Attending: Certified Nurse Midwife | Admitting: Certified Nurse Midwife

## 2023-04-22 VITALS — BP 134/81 | HR 96 | Wt 209.1 lb

## 2023-04-22 DIAGNOSIS — Z3A21 21 weeks gestation of pregnancy: Secondary | ICD-10-CM

## 2023-04-22 DIAGNOSIS — N898 Other specified noninflammatory disorders of vagina: Secondary | ICD-10-CM | POA: Diagnosis not present

## 2023-04-22 DIAGNOSIS — N760 Acute vaginitis: Secondary | ICD-10-CM

## 2023-04-22 DIAGNOSIS — B9689 Other specified bacterial agents as the cause of diseases classified elsewhere: Secondary | ICD-10-CM

## 2023-04-22 DIAGNOSIS — Z3492 Encounter for supervision of normal pregnancy, unspecified, second trimester: Secondary | ICD-10-CM

## 2023-04-22 DIAGNOSIS — B3731 Acute candidiasis of vulva and vagina: Secondary | ICD-10-CM

## 2023-04-22 NOTE — Progress Notes (Signed)
   PRENATAL VISIT NOTE  Subjective:  Kayla Potts is a 32 y.o. G3P1011 at [redacted]w[redacted]d being seen today for ongoing prenatal care.  She is currently monitored for the following issues for this low-risk pregnancy and has Supervision of low-risk pregnancy and History of pre-eclampsia on their problem list.  Patient reports  vaginal discharge with one episode of light spotting .  Contractions: Not present. Vag. Bleeding: None.  Movement: Present. Denies leaking of fluid.   The following portions of the patient's history were reviewed and updated as appropriate: allergies, current medications, past family history, past medical history, past social history, past surgical history and problem list.   Objective:   Vitals:   04/22/23 1359  BP: 134/81  Pulse: 96  Weight: 209 lb 1.6 oz (94.8 kg)   Fetal Status: Fetal Heart Rate (bpm): 160   Movement: Present     General:  Alert, oriented and cooperative. Patient is in no acute distress.  Skin: Skin is warm and dry. No rash noted.   Cardiovascular: Normal heart rate noted  Respiratory: Normal respiratory effort, no problems with respiration noted  Abdomen: Soft, gravid, appropriate for gestational age.  Pain/Pressure: Absent     Pelvic: Cervical exam deferred        Extremities: Normal range of motion.  Edema: None  Mental Status: Normal mood and affect. Normal behavior. Normal judgment and thought content.   Assessment and Plan:  Pregnancy: G3P1011 at [redacted]w[redacted]d 1. Encounter for supervision of low-risk pregnancy in second trimester - Doing well, feeling regular and vigorous fetal movement   2. [redacted] weeks gestation of pregnancy - Routine OB care  - AFP, Serum, Open Spina Bifida  3. Vaginal discharge - Cervicovaginal ancillary only( [place])  Preterm labor symptoms and general obstetric precautions including but not limited to vaginal bleeding, contractions, leaking of fluid and fetal movement were reviewed in detail with the patient. Please  refer to After Visit Summary for other counseling recommendations.   Return in about 5 weeks (around 05/27/2023) for IN-PERSON, LOB/GTT.  Future Appointments  Date Time Provider Department Center  05/13/2023  3:30 PM [facility] NURSE [facility]  05/13/2023  3:45 PM [facility] US4 [facility]  05/20/2023  2:35 PM Bernerd Limbo, CNM [facility]   Bernerd Limbo, CNM

## 2023-04-22 NOTE — Patient Instructions (Signed)
Alaffia African Black Soap with Tea Tree Lume deo between the thighs

## 2023-04-23 LAB — CERVICOVAGINAL ANCILLARY ONLY
Bacterial Vaginitis (gardnerella): POSITIVE — AB
Candida Glabrata: NEGATIVE
Candida Vaginitis: POSITIVE — AB
Chlamydia: NEGATIVE
Comment: NEGATIVE
Comment: NEGATIVE
Comment: NEGATIVE
Comment: NEGATIVE
Comment: NEGATIVE
Comment: NORMAL
Neisseria Gonorrhea: NEGATIVE
Trichomonas: NEGATIVE

## 2023-04-25 LAB — AFP, SERUM, OPEN SPINA BIFIDA
AFP MoM: 0.75
AFP Value: 48.5 ng/mL
Gest. Age on Collection Date: 21.6 weeks
Maternal Age At EDD: 30.6 yr
OSBR Risk 1 IN: 10000
Test Results:: NEGATIVE
Weight: 209 [lb_av]

## 2023-04-26 MED ORDER — TERCONAZOLE 0.4 % VA CREA: 1.0000 | TOPICAL_CREAM | Freq: Every day | VAGINAL | 0 refills | Status: DC

## 2023-04-26 MED ORDER — METRONIDAZOLE 500 MG PO TABS: 500.0000 mg | ORAL_TABLET | Freq: Two times a day (BID) | ORAL | 0 refills | Status: DC

## 2023-04-26 NOTE — Addendum Note (Signed)
Addended by: Edd Arbour on: 04/26/2023 01:25 AM   Modules accepted: Orders

## 2023-05-12 ENCOUNTER — Telehealth: Payer: Self-pay | Admitting: Family Medicine

## 2023-05-12 NOTE — Telephone Encounter (Signed)
Returned patients call. She is asking about Diclegis. Per patient Medicaid will not cover generic. She reports the Pharmacy is requesting a PA.   Called Pharmacy and they are closed for lunch. Will have to call at a later time.

## 2023-05-12 NOTE — Telephone Encounter (Signed)
Called and spoke with Kayla Potts in the Pharmacy. She reports she spoke with someone last week. She reports primary insurance only pays for Generic and Medicaid pays for brand name. It will go through both insurances. Whoever spoke with office last week reported they would not do a PA and it cost Kayla Potts $200. So they need a PA for Medicaid to pay for generic or PA for Primary to pay for brand name.   The first time Medicaid approved it as an override and now will not accept it. Recommendation is to ask Primary insurance to pay for brand name.   BCBS insurance card not on file. Called Kayla Potts to ask her to send front and back pics of BCBS and upload to My Chart so we can process the PA with Primary Insurance.

## 2023-05-12 NOTE — Telephone Encounter (Signed)
Patient has questions about her medications and authorization

## 2023-05-12 NOTE — Telephone Encounter (Signed)
Doxylamine-pyridoxine prior auth initiated via Covermymeds  (Key: [id number])   [employer] [place] is processing your PA request and will respond shortly with next steps. You may close this dialog, return to your dashboard, and perform other tasks. To check for an update later, open this request again from your dashboard.  If you need assistance, please chat with CoverMyMeds or call us at [phone].

## 2023-05-13 ENCOUNTER — Ambulatory Visit: Payer: BC Managed Care – PPO | Admitting: *Deleted

## 2023-05-13 ENCOUNTER — Ambulatory Visit: Payer: BC Managed Care – PPO | Attending: Obstetrics

## 2023-05-13 VITALS — BP 123/61 | HR 88

## 2023-05-13 DIAGNOSIS — O99210 Obesity complicating pregnancy, unspecified trimester: Secondary | ICD-10-CM | POA: Diagnosis present

## 2023-05-13 DIAGNOSIS — Z3A24 24 weeks gestation of pregnancy: Secondary | ICD-10-CM | POA: Diagnosis not present

## 2023-05-13 DIAGNOSIS — O09292 Supervision of pregnancy with other poor reproductive or obstetric history, second trimester: Secondary | ICD-10-CM | POA: Diagnosis not present

## 2023-05-13 DIAGNOSIS — Z362 Encounter for other antenatal screening follow-up: Secondary | ICD-10-CM | POA: Diagnosis present

## 2023-05-13 DIAGNOSIS — Z8759 Personal history of other complications of pregnancy, childbirth and the puerperium: Secondary | ICD-10-CM | POA: Diagnosis present

## 2023-05-14 ENCOUNTER — Other Ambulatory Visit: Payer: Self-pay | Admitting: *Deleted

## 2023-05-14 DIAGNOSIS — O09299 Supervision of pregnancy with other poor reproductive or obstetric history, unspecified trimester: Secondary | ICD-10-CM

## 2023-05-20 ENCOUNTER — Encounter: Payer: BC Managed Care – PPO | Admitting: Certified Nurse Midwife

## 2023-05-20 ENCOUNTER — Other Ambulatory Visit: Payer: Self-pay

## 2023-05-20 DIAGNOSIS — Z3492 Encounter for supervision of normal pregnancy, unspecified, second trimester: Secondary | ICD-10-CM

## 2023-05-26 NOTE — Progress Notes (Unsigned)
   PRENATAL VISIT NOTE  Subjective:  Kayla Potts is a 32 y.o. G3P1011 at [redacted]w[redacted]d being seen today for ongoing prenatal care.  She is currently monitored for the following issues for this low-risk pregnancy and has Supervision of low-risk pregnancy and History of pre-eclampsia on their problem list.  Patient reports {sx:14538}.   .  .   . Denies leaking of fluid.   The following portions of the patient's history were reviewed and updated as appropriate: allergies, current medications, past family history, past medical history, past social history, past surgical history and problem list.   Objective:  There were no vitals filed for this visit.  Fetal Status:           General:  Alert, oriented and cooperative. Patient is in no acute distress.  Skin: Skin is warm and dry. No rash noted.   Cardiovascular: Normal heart rate noted  Respiratory: Normal respiratory effort, no problems with respiration noted  Abdomen: Soft, gravid, appropriate for gestational age.        Pelvic: Cervical exam deferred        Extremities: Normal range of motion.     Mental Status: Normal mood and affect. Normal behavior. Normal judgment and thought content.   Assessment and Plan:  Pregnancy: G3P1011 at [redacted]w[redacted]d 1. Encounter for supervision of low-risk pregnancy in second trimester - Doing well, feeling regular and vigorous fetal movement   2. [redacted] weeks gestation of pregnancy - Routine OB care including GTT today  3. History of pre-eclampsia - BP stable, no s/sx PEC. Taking low dose aspirin.  Preterm labor symptoms and general obstetric precautions including but not limited to vaginal bleeding, contractions, leaking of fluid and fetal movement were reviewed in detail with the patient. Please refer to After Visit Summary for other counseling recommendations.   No follow-ups on file.  Future Appointments  Date Time Provider Department Center  05/27/2023  8:50 AM [facility] LAB [facility]  05/27/2023  9:15 AM  [facility]  06/17/2023  3:30 PM [facility] NURSE [facility]  06/17/2023  3:45 PM [facility] US5 [facility]    Bernerd Limbo, CNM

## 2023-05-27 ENCOUNTER — Other Ambulatory Visit: Payer: Self-pay

## 2023-05-27 ENCOUNTER — Other Ambulatory Visit: Payer: BC Managed Care – PPO

## 2023-05-27 ENCOUNTER — Ambulatory Visit (INDEPENDENT_AMBULATORY_CARE_PROVIDER_SITE_OTHER): Payer: BC Managed Care – PPO | Admitting: Certified Nurse Midwife

## 2023-05-27 ENCOUNTER — Other Ambulatory Visit (HOSPITAL_COMMUNITY): Admission: RE | Admit: 2023-05-27 | Payer: BC Managed Care – PPO | Source: Ambulatory Visit

## 2023-05-27 VITALS — BP 135/73 | HR 96 | Wt 214.9 lb

## 2023-05-27 DIAGNOSIS — Z8759 Personal history of other complications of pregnancy, childbirth and the puerperium: Secondary | ICD-10-CM

## 2023-05-27 DIAGNOSIS — N760 Acute vaginitis: Secondary | ICD-10-CM

## 2023-05-27 DIAGNOSIS — Z3A26 26 weeks gestation of pregnancy: Secondary | ICD-10-CM

## 2023-05-27 DIAGNOSIS — B3731 Acute candidiasis of vulva and vagina: Secondary | ICD-10-CM

## 2023-05-27 DIAGNOSIS — Z3492 Encounter for supervision of normal pregnancy, unspecified, second trimester: Secondary | ICD-10-CM

## 2023-05-27 DIAGNOSIS — B9689 Other specified bacterial agents as the cause of diseases classified elsewhere: Secondary | ICD-10-CM

## 2023-05-28 LAB — CERVICOVAGINAL ANCILLARY ONLY
Bacterial Vaginitis (gardnerella): POSITIVE — AB
Candida Glabrata: NEGATIVE
Candida Vaginitis: POSITIVE — AB
Chlamydia: NEGATIVE
Comment: NEGATIVE
Comment: NEGATIVE
Comment: NEGATIVE
Comment: NEGATIVE
Comment: NEGATIVE
Comment: NORMAL
Neisseria Gonorrhea: NEGATIVE
Trichomonas: NEGATIVE

## 2023-05-28 LAB — HIV ANTIBODY (ROUTINE TESTING W REFLEX): HIV Screen 4th Generation wRfx: NONREACTIVE

## 2023-05-28 LAB — CBC
Hematocrit: 32.9 % — ABNORMAL LOW (ref 34.0–46.6)
Hemoglobin: 11 g/dL — ABNORMAL LOW (ref 11.1–15.9)
MCH: 30.5 pg (ref 26.6–33.0)
MCHC: 33.4 g/dL (ref 31.5–35.7)
MCV: 91 fL (ref 79–97)
Platelets: 151 10*3/uL (ref 150–450)
RBC: 3.61 x10E6/uL — ABNORMAL LOW (ref 3.77–5.28)
RDW: 12.4 % (ref 11.7–15.4)
WBC: 8.8 10*3/uL (ref 3.4–10.8)

## 2023-05-28 LAB — RPR: RPR Ser Ql: NONREACTIVE

## 2023-05-28 LAB — GLUCOSE TOLERANCE, 1 HOUR: Glucose, 1Hr PP: 99 mg/dL (ref 70–199)

## 2023-05-28 MED ORDER — METRONIDAZOLE 500 MG PO TABS: 500.0000 mg | ORAL_TABLET | Freq: Two times a day (BID) | ORAL | 0 refills | Status: DC

## 2023-05-28 MED ORDER — TERCONAZOLE 0.4 % VA CREA: 1.0000 | TOPICAL_CREAM | Freq: Every day | VAGINAL | 0 refills | Status: DC

## 2023-06-16 NOTE — Progress Notes (Unsigned)
   PRENATAL VISIT NOTE  Subjective:  Kayla Potts is a 32 y.o. G3P1011 at [redacted]w[redacted]d being seen today for ongoing prenatal care.  She is currently monitored for the following issues for this {Blank single:19197::"high-risk","low-risk"} pregnancy and has Supervision of low-risk pregnancy and History of pre-eclampsia on their problem list.  Patient reports {sx:14538}.   .  .   . Denies leaking of fluid.   The following portions of the patient's history were reviewed and updated as appropriate: allergies, current medications, past family history, past medical history, past social history, past surgical history and problem list.   Objective:  There were no vitals filed for this visit.  Fetal Status:           General:  Alert, oriented and cooperative. Patient is in no acute distress.  Skin: Skin is warm and dry. No rash noted.   Cardiovascular: Normal heart rate noted  Respiratory: Normal respiratory effort, no problems with respiration noted  Abdomen: Soft, gravid, appropriate for gestational age.        Pelvic: {Blank single:19197::"Cervical exam performed in the presence of a chaperone","Cervical exam deferred"}        Extremities: Normal range of motion.     Mental Status: Normal mood and affect. Normal behavior. Normal judgment and thought content.   Assessment and Plan:  Pregnancy: G3P1011 at [redacted]w[redacted]d 1. Encounter for supervision of low-risk pregnancy in third trimester ***  2. [redacted] weeks gestation of pregnancy ***  3. History of pre-eclampsia ***  {Blank single:19197::"Term","Preterm"} labor symptoms and general obstetric precautions including but not limited to vaginal bleeding, contractions, leaking of fluid and fetal movement were reviewed in detail with the patient. Please refer to After Visit Summary for other counseling recommendations.   No follow-ups on file.  Future Appointments  Date Time Provider Department Center  06/17/2023  1:15 PM [facility] [place] [facility]   06/17/2023  3:30 PM [facility] NURSE [facility]  06/17/2023  3:45 PM [facility] US5 [facility]  07/01/2023  9:15 AM [facility] [place] [facility]  07/15/2023 10:55 AM Bernerd Limbo, CNM [facility]    Bernerd Limbo, CNM

## 2023-06-17 ENCOUNTER — Ambulatory Visit: Payer: BC Managed Care – PPO | Admitting: Certified Nurse Midwife

## 2023-06-17 ENCOUNTER — Ambulatory Visit: Payer: BC Managed Care – PPO | Attending: Maternal & Fetal Medicine

## 2023-06-17 ENCOUNTER — Ambulatory Visit: Payer: BC Managed Care – PPO | Admitting: *Deleted

## 2023-06-17 VITALS — BP 126/82 | HR 78

## 2023-06-17 VITALS — BP 128/77 | HR 104 | Wt 218.0 lb

## 2023-06-17 DIAGNOSIS — O09293 Supervision of pregnancy with other poor reproductive or obstetric history, third trimester: Secondary | ICD-10-CM | POA: Diagnosis not present

## 2023-06-17 DIAGNOSIS — O09299 Supervision of pregnancy with other poor reproductive or obstetric history, unspecified trimester: Secondary | ICD-10-CM | POA: Insufficient documentation

## 2023-06-17 DIAGNOSIS — Z3493 Encounter for supervision of normal pregnancy, unspecified, third trimester: Secondary | ICD-10-CM

## 2023-06-17 DIAGNOSIS — Z8759 Personal history of other complications of pregnancy, childbirth and the puerperium: Secondary | ICD-10-CM

## 2023-06-17 DIAGNOSIS — Z3A29 29 weeks gestation of pregnancy: Secondary | ICD-10-CM

## 2023-06-17 DIAGNOSIS — O219 Vomiting of pregnancy, unspecified: Secondary | ICD-10-CM

## 2023-06-17 MED ORDER — ONDANSETRON HCL 4 MG PO TABS
4.0000 mg | ORAL_TABLET | Freq: Three times a day (TID) | ORAL | 0 refills | Status: DC | PRN
Start: 2023-06-17 — End: 2023-10-28

## 2023-06-17 MED ORDER — DOXYLAMINE-PYRIDOXINE 10-10 MG PO TBEC
1.0000 | DELAYED_RELEASE_TABLET | Freq: Every day | ORAL | 1 refills | Status: DC
Start: 2023-06-17 — End: 2023-07-28

## 2023-06-18 ENCOUNTER — Telehealth: Payer: Self-pay | Admitting: Lactation Services

## 2023-06-18 ENCOUNTER — Other Ambulatory Visit: Payer: Self-pay | Admitting: *Deleted

## 2023-06-18 DIAGNOSIS — O09299 Supervision of pregnancy with other poor reproductive or obstetric history, unspecified trimester: Secondary | ICD-10-CM

## 2023-06-18 NOTE — Telephone Encounter (Signed)
[facility] and they report the cost is the same with or without her insurance. Reviewed that PA for medicaid denied and reports must go through primary Payor. PA was submitted twice for brand and generic with same result.

## 2023-06-18 NOTE — Telephone Encounter (Signed)
[name] ([facility]) Rx #: [id number] Need Help? Call us at [phone] Outcome Additional Information Required Submit Bill To Other Processor Or Primary Payer Drug Doxylamine-Pyridoxine 10-10MG  dr tablets Form [employer] [facility] Electronic [place] Form [phone] NCPDP) Original Claim Info [phone]

## 2023-06-18 NOTE — Telephone Encounter (Signed)
[name] ([employer] [facility]) Rx #: [id number] Need Help? Call us at [phone] Status Sent to Plan today Drug Doxylamine-Pyridoxine 10-10MG  dr tablet Form [employer] [facility] Electronic [place] Form [phone] NCPDP) Original Claim Info

## 2023-06-30 NOTE — Progress Notes (Unsigned)
   PRENATAL VISIT NOTE  Subjective:  Kayla Potts is a 32 y.o. G3P1011 at [redacted]w[redacted]d being seen today for ongoing prenatal care.  She is currently monitored for the following issues for this {Blank single:19197::"high-risk","low-risk"} pregnancy and has Supervision of low-risk pregnancy and History of pre-eclampsia on their problem list.  Patient reports {sx:14538}.   .  .   . Denies leaking of fluid.   The following portions of the patient's history were reviewed and updated as appropriate: allergies, current medications, past family history, past medical history, past social history, past surgical history and problem list.   Objective:  There were no vitals filed for this visit.  Fetal Status:           General:  Alert, oriented and cooperative. Patient is in no acute distress.  Skin: Skin is warm and dry. No rash noted.   Cardiovascular: Normal heart rate noted  Respiratory: Normal respiratory effort, no problems with respiration noted  Abdomen: Soft, gravid, appropriate for gestational age.        Pelvic: {Blank single:19197::"Cervical exam performed in the presence of a chaperone","Cervical exam deferred"}        Extremities: Normal range of motion.     Mental Status: Normal mood and affect. Normal behavior. Normal judgment and thought content.   Assessment and Plan:  Pregnancy: G3P1011 at [redacted]w[redacted]d 1. Encounter for supervision of low-risk pregnancy in third trimester ***  2. [redacted] weeks gestation of pregnancy ***  3. History of pre-eclampsia ***  {Blank single:19197::"Term","Preterm"} labor symptoms and general obstetric precautions including but not limited to vaginal bleeding, contractions, leaking of fluid and fetal movement were reviewed in detail with the patient. Please refer to After Visit Summary for other counseling recommendations.   No follow-ups on file.  Future Appointments  Date Time Provider Department Center  07/01/2023  9:15 AM Osborne Oman [facility]   07/15/2023 10:55 AM Bernerd Limbo, CNM [facility]  07/15/2023  1:45 PM [facility]SE [facility]  07/15/2023  2:00 PM [facility] US1 [facility]    Bernerd Limbo, CNM

## 2023-07-01 ENCOUNTER — Other Ambulatory Visit: Payer: Self-pay

## 2023-07-01 ENCOUNTER — Ambulatory Visit (INDEPENDENT_AMBULATORY_CARE_PROVIDER_SITE_OTHER): Payer: BC Managed Care – PPO | Admitting: Certified Nurse Midwife

## 2023-07-01 VITALS — BP 132/75 | HR 87 | Wt 217.6 lb

## 2023-07-01 DIAGNOSIS — F419 Anxiety disorder, unspecified: Secondary | ICD-10-CM

## 2023-07-01 DIAGNOSIS — Z3A31 31 weeks gestation of pregnancy: Secondary | ICD-10-CM

## 2023-07-01 DIAGNOSIS — O219 Vomiting of pregnancy, unspecified: Secondary | ICD-10-CM

## 2023-07-01 DIAGNOSIS — Z8759 Personal history of other complications of pregnancy, childbirth and the puerperium: Secondary | ICD-10-CM

## 2023-07-01 DIAGNOSIS — Z3493 Encounter for supervision of normal pregnancy, unspecified, third trimester: Secondary | ICD-10-CM

## 2023-07-01 NOTE — Patient Instructions (Addendum)
Unisom (doxylamine succinate 25 mg tablets) Take one tablet daily at bedtime. If symptoms are not adequately controlled, the dose can be increased to a maximum recommended dose of two tablets daily (1/2 tablet in the morning, 1/2 tablet mid-afternoon and one at bedtime).  Vitamin B6 100mg  tablets. Take one tablet twice a day (up to 200 mg per day).        Red raspberry leaf tea daily with local honey 830mg  DHA daily ([employer] is my favorite brand)  Look up bullet journaling, work on delegating tasks, set boundaries, organize according to what makes your life easier.

## 2023-07-15 ENCOUNTER — Ambulatory Visit (HOSPITAL_BASED_OUTPATIENT_CLINIC_OR_DEPARTMENT_OTHER): Payer: BC Managed Care – PPO

## 2023-07-15 ENCOUNTER — Ambulatory Visit: Payer: BC Managed Care – PPO | Attending: Obstetrics | Admitting: *Deleted

## 2023-07-15 ENCOUNTER — Ambulatory Visit: Payer: BC Managed Care – PPO | Admitting: Certified Nurse Midwife

## 2023-07-15 VITALS — BP 135/80 | HR 92

## 2023-07-15 VITALS — BP 136/83 | HR 106 | Wt 223.5 lb

## 2023-07-15 DIAGNOSIS — O09293 Supervision of pregnancy with other poor reproductive or obstetric history, third trimester: Secondary | ICD-10-CM

## 2023-07-15 DIAGNOSIS — Z362 Encounter for other antenatal screening follow-up: Secondary | ICD-10-CM | POA: Insufficient documentation

## 2023-07-15 DIAGNOSIS — O09299 Supervision of pregnancy with other poor reproductive or obstetric history, unspecified trimester: Secondary | ICD-10-CM | POA: Insufficient documentation

## 2023-07-15 DIAGNOSIS — Z3493 Encounter for supervision of normal pregnancy, unspecified, third trimester: Secondary | ICD-10-CM

## 2023-07-15 DIAGNOSIS — Z3A33 33 weeks gestation of pregnancy: Secondary | ICD-10-CM

## 2023-07-15 DIAGNOSIS — F419 Anxiety disorder, unspecified: Secondary | ICD-10-CM

## 2023-07-15 DIAGNOSIS — Z8759 Personal history of other complications of pregnancy, childbirth and the puerperium: Secondary | ICD-10-CM

## 2023-07-15 NOTE — Progress Notes (Unsigned)
   PRENATAL VISIT NOTE  Subjective:  Kayla Potts is a 32 y.o. G3P1011 at [redacted]w[redacted]d being seen today for ongoing prenatal care.  She is currently monitored for the following issues for this {Blank single:19197::"high-risk","low-risk"} pregnancy and has Supervision of low-risk pregnancy and History of pre-eclampsia on their problem list.  Patient reports {sx:14538}.   .  .   . Denies leaking of fluid.   The following portions of the patient's history were reviewed and updated as appropriate: allergies, current medications, past family history, past medical history, past social history, past surgical history and problem list.   Objective:  There were no vitals filed for this visit.  Fetal Status:           General:  Alert, oriented and cooperative. Patient is in no acute distress.  Skin: Skin is warm and dry. No rash noted.   Cardiovascular: Normal heart rate noted  Respiratory: Normal respiratory effort, no problems with respiration noted  Abdomen: Soft, gravid, appropriate for gestational age.        Pelvic: {Blank single:19197::"Cervical exam performed in the presence of a chaperone","Cervical exam deferred"}        Extremities: Normal range of motion.     Mental Status: Normal mood and affect. Normal behavior. Normal judgment and thought content.   Assessment and Plan:  Pregnancy: G3P1011 at [redacted]w[redacted]d 1. Encounter for supervision of low-risk pregnancy in third trimester ***  2. [redacted] weeks gestation of pregnancy ***  3. History of pre-eclampsia ***  {Blank single:19197::"Term","Preterm"} labor symptoms and general obstetric precautions including but not limited to vaginal bleeding, contractions, leaking of fluid and fetal movement were reviewed in detail with the patient. Please refer to After Visit Summary for other counseling recommendations.   No follow-ups on file.  Future Appointments  Date Time Provider Department Center  07/15/2023 10:55 AM Osborne Oman [facility]   07/15/2023  1:45 PM [facility]URSE [facility]  07/15/2023  2:00 PM [facility] US1 [facility]  07/22/2023 10:35 AM Bernerd Limbo, CNM [facility]  08/05/2023 10:55 AM Bernerd Limbo, CNM [facility]  08/12/2023 10:35 AM Bernerd Limbo, CNM [facility]  08/19/2023 10:55 AM Bernerd Limbo, CNM [facility]    Bernerd Limbo, CNM

## 2023-07-22 ENCOUNTER — Encounter: Payer: BC Managed Care – PPO | Admitting: Certified Nurse Midwife

## 2023-07-28 NOTE — Progress Notes (Unsigned)
Converted to virtual visit

## 2023-07-29 ENCOUNTER — Ambulatory Visit: Payer: BC Managed Care – PPO

## 2023-07-29 ENCOUNTER — Telehealth (INDEPENDENT_AMBULATORY_CARE_PROVIDER_SITE_OTHER): Payer: BC Managed Care – PPO | Admitting: Certified Nurse Midwife

## 2023-07-29 ENCOUNTER — Ambulatory Visit (INDEPENDENT_AMBULATORY_CARE_PROVIDER_SITE_OTHER): Payer: BC Managed Care – PPO | Admitting: Certified Nurse Midwife

## 2023-07-29 VITALS — BP 139/80 | HR 86

## 2023-07-29 DIAGNOSIS — Z8759 Personal history of other complications of pregnancy, childbirth and the puerperium: Secondary | ICD-10-CM

## 2023-07-29 DIAGNOSIS — F5104 Psychophysiologic insomnia: Secondary | ICD-10-CM

## 2023-07-29 DIAGNOSIS — Z3493 Encounter for supervision of normal pregnancy, unspecified, third trimester: Secondary | ICD-10-CM

## 2023-07-29 DIAGNOSIS — O09893 Supervision of other high risk pregnancies, third trimester: Secondary | ICD-10-CM

## 2023-07-29 DIAGNOSIS — O26893 Other specified pregnancy related conditions, third trimester: Secondary | ICD-10-CM

## 2023-07-29 DIAGNOSIS — Z3A35 35 weeks gestation of pregnancy: Secondary | ICD-10-CM

## 2023-07-29 DIAGNOSIS — R102 Pelvic and perineal pain: Secondary | ICD-10-CM

## 2023-07-29 DIAGNOSIS — Z3A36 36 weeks gestation of pregnancy: Secondary | ICD-10-CM

## 2023-07-29 DIAGNOSIS — O0993 Supervision of high risk pregnancy, unspecified, third trimester: Secondary | ICD-10-CM

## 2023-07-29 NOTE — Progress Notes (Signed)
OBSTETRICS PRENATAL VIRTUAL VISIT ENCOUNTER NOTE  Provider location: [facility]   Patient location: Home  I connected with Kayla Potts on 07/29/23 at  3:15 PM EDT by MyChart Video Encounter and verified that I am speaking with the correct person using two identifiers. I discussed the limitations, risks, security and privacy concerns of performing an evaluation and management service virtually and the availability of in person appointments. I also discussed with the patient that there may be a patient responsible charge related to this service. The patient expressed understanding and agreed to proceed. Subjective:  Kayla Potts is a 32 y.o. G3P1011 at [redacted]w[redacted]d being seen today for ongoing prenatal care.  She is currently monitored for the following issues for this low-risk pregnancy and has Supervision of low-risk pregnancy and History of pre-eclampsia on their problem list.  Patient reports  pelvic pain when sitting for too long .  Contractions: Not present.  .  Movement: Present. Denies any leaking of fluid.   The following portions of the patient's history were reviewed and updated as appropriate: allergies, current medications, past family history, past medical history, past social history, past surgical history and problem list.   Objective:   Vitals:   07/29/23 1528  BP: 139/80  Pulse: 86    Fetal Status:     Movement: Present     General:  Alert, oriented and cooperative. Patient is in no acute distress.  Respiratory: Normal respiratory effort, no problems with respiration noted  Mental Status: Normal mood and affect. Normal behavior. Normal judgment and thought content.  Rest of physical exam deferred due to type of encounter  Imaging: [place] MFM OB FOLLOW UP  Result Date: 07/15/2023 ----------------------------------------------------------------------  OBSTETRICS REPORT                       (Signed Final 07/15/2023 02:40 pm)  ---------------------------------------------------------------------- Patient Info  ID #:       [id number]                          D.O.B.:  1991-09-19 (32 yrs)  Name:       Kayla Potts                  Visit Date: 07/15/2023 02:00 pm ---------------------------------------------------------------------- Performed By  Attending:        Noralee Space MD        Ref. Address:     [address]                                                             [place], [place]                                                             [address]  Performed By:     Earley Brooke     [facility]  Referred By:      [facility] ---------------------------------------------------------------------- Orders  #  Description                           Code        Ordered By  1  [place] MFM OB FOLLOW UP                   [phone]    Rosana Hoes ----------------------------------------------------------------------  #  Order #                     Accession #                Episode #  1  [id number]                   [id number]                 [id number] ---------------------------------------------------------------------- Indications  Poor obstetric history: Previous               O09.299  preeclampsia / eclampsia/gestational HTN  [redacted] weeks gestation of pregnancy                Z3A.33  Poor obstetrical history (Postpartum           O09.299  Hemorrhage)  Low Risk NIPS/Negative Horizon  Encounter for other antenatal screening        Z36.2  follow-up ---------------------------------------------------------------------- Vital Signs                                                 Height:        5'5"  BP:          135/80  ---------------------------------------------------------------------- Fetal Evaluation  Num Of Fetuses:         1  Fetal Heart Rate(bpm):  158  Cardiac Activity:       Observed  Presentation:           Cephalic  Placenta:               Right lateral  P. Cord Insertion:      Previously seen  Amniotic Fluid  AFI FV:      Within normal limits  AFI Sum(cm)     %Tile       Largest Pocket(cm)  11.81           32          3.47  RUQ(cm)       RLQ(cm)       LUQ(cm)        LLQ(cm)  2.96          3.47          2.39           2.99 ---------------------------------------------------------------------- Biometry  BPD:      81.7  mm     G. Age:  32w 6d         19  %    CI:        72.54   %    70 - 86                                                          FL/HC:      21.4   %    19.4 - 21.8  HC:      305.1  mm     G. Age:  34w 0d         17  %    HC/AC:      1.01        0.96 - 1.11  AC:      301.2  mm     G. Age:  34w 1d         60  %    FL/BPD:     80.0   %    71 - 87  FL:       65.4  mm     G. Age:  33w 5d         36  %    FL/AC:      21.7   %    20 - 24  HUM:      60.3  mm     G. Age:  35w 0d         79  %  Est. FW:    2289  gm      5 lb 1 oz     42  % ---------------------------------------------------------------------- OB History  Gravidity:    3         Term:   1        Prem:   0        SAB:   1  TOP:          0       Ectopic:  0        Living: 1 ---------------------------------------------------------------------- Gestational Age  LMP:           33w 6d        Date:  11/20/22                   EDD:   08/27/23  U/S Today:     33w 5d                                        EDD:   08/28/23  Best:  33w 6d     Det. By:  LMP  (11/20/22)          EDD:   08/27/23 ---------------------------------------------------------------------- Anatomy  Cranium:               Appears normal         Aortic Arch:            Previously seen  Cavum:                 Previously seen        Ductal Arch:            Previously seen  Ventricles:             Previously seen        Diaphragm:              Appears normal  Choroid Plexus:        Previously seen        Stomach:                Appears normal, left                                                                        sided  Cerebellum:            Previously seen        Abdomen:                Appears normal  Posterior Fossa:       Previously seen        Abdominal Wall:         Previously seen  Nuchal Fold:           Previously seen        Cord Vessels:           Previously seen  Face:                  Orbits and profile     Kidneys:                Appear normal                         previously seen  Lips:                  Previously seen        Bladder:                Appears normal  Thoracic:              Appears normal         Spine:                  Previously seen  Heart:                 Previously seen        Upper Extremities:      Previously seen  RVOT:                  Previously seen        Lower Extremities:      Previously seen  LVOT:                  Previously seen  Other:  SVC/IVC, 3VV, 3VTV. Spine, ACI, PCI  Hands ,feet previously          visualized. Heels previously seen. Nasal bone previously seen.          Gender secret. ---------------------------------------------------------------------- Cervix Uterus Adnexa  Cervix  Not visualized (advanced GA >24wks) ---------------------------------------------------------------------- Impression  History of preeclampsia.   Patient does not have gestational diabetes .  Fetal growth is appropriate for gestational age .Amniotic fluid  is normal and good fetal activity is seen . Cephalic  presentation. ---------------------------------------------------------------------- Recommendations  Follow-up scans as clinically indicated. ----------------------------------------------------------------------                 Noralee Space, MD Electronically Signed Final Report   07/15/2023 02:40 pm  ----------------------------------------------------------------------    Assessment and Plan:  Pregnancy: G3P1011 at [redacted]w[redacted]d 1. Supervision of high risk pregnancy in third trimester - Doing well, feeling regular and vigorous fetal movement   2. [redacted] weeks gestation of pregnancy - Routine OB care, will get GBS  3. Pelvic pain affecting pregnancy in third trimester, antepartum - Advised to change positions regularly   4. History of pre-eclampsia - Stable, BP a little elevated today but has a smaller cuff at home  5. Psychophysiological insomnia - Taking magnesium, can try melatonin (up to 5mg )  Preterm labor symptoms and general obstetric precautions including but not limited to vaginal bleeding, contractions, leaking of fluid and fetal movement were reviewed in detail with the patient. I discussed the assessment and treatment plan with the patient. The patient was provided an opportunity to ask questions and all were answered. The patient agreed with the plan and demonstrated an understanding of the instructions. The patient was advised to call back or seek an in-person office evaluation/go to [facility]U at [facility] for any urgent or concerning symptoms. Please refer to After Visit Summary for other counseling recommendations.   I provided 15 minutes of face-to-face time during this encounter.  Return in about 1 week (around 08/05/2023) for IN-PERSON, HOB/GBS.  Future Appointments  Date Time Provider Department Center  08/05/2023 10:55 AM [facility]borne [facility]  08/12/2023 10:35 AM Bernerd Limbo, CNM [facility]  08/19/2023 10:55 AM Bernerd Limbo, CNM [facility]    Bernerd Limbo, CNM Center for [employer]

## 2023-08-03 ENCOUNTER — Telehealth: Payer: Self-pay | Admitting: General Practice

## 2023-08-03 NOTE — Telephone Encounter (Signed)
Patient called and left message on nurse voicemail line stating she sent a message already about her concern but she wanted someone to call her back for follow up.   Called patient and discussed that it appears her concern has already been addressed but I wanted to make sure she didn't need anything else. Patient states yes everything is taken care of and she appreciates the follow up. Patient had no questions.

## 2023-08-04 NOTE — Progress Notes (Unsigned)
   PRENATAL VISIT NOTE  Subjective:  Kayla Potts is a 32 y.o. G3P1011 at [redacted]w[redacted]d being seen today for ongoing prenatal care.  She is currently monitored for the following issues for this {Blank single:19197::"high-risk","low-risk"} pregnancy and has Supervision of low-risk pregnancy and History of pre-eclampsia on their problem list.  Patient reports {sx:14538}.   .  .   . Denies leaking of fluid.   The following portions of the patient's history were reviewed and updated as appropriate: allergies, current medications, past family history, past medical history, past social history, past surgical history and problem list.   Objective:  There were no vitals filed for this visit.  Fetal Status:           General:  Alert, oriented and cooperative. Patient is in no acute distress.  Skin: Skin is warm and dry. No rash noted.   Cardiovascular: Normal heart rate noted  Respiratory: Normal respiratory effort, no problems with respiration noted  Abdomen: Soft, gravid, appropriate for gestational age.        Pelvic: {Blank single:19197::"Cervical exam performed in the presence of a chaperone","Cervical exam deferred"}        Extremities: Normal range of motion.     Mental Status: Normal mood and affect. Normal behavior. Normal judgment and thought content.   Assessment and Plan:  Pregnancy: G3P1011 at [redacted]w[redacted]d 1. Encounter for supervision of low-risk pregnancy in third trimester ***  2. [redacted] weeks gestation of pregnancy ***  3. History of pre-eclampsia ***  {Blank single:19197::"Term","Preterm"} labor symptoms and general obstetric precautions including but not limited to vaginal bleeding, contractions, leaking of fluid and fetal movement were reviewed in detail with the patient. Please refer to After Visit Summary for other counseling recommendations.   No follow-ups on file.  Future Appointments  Date Time Provider Department Center  08/05/2023 10:55 AM Osborne Oman [facility]   08/12/2023 10:35 AM Bernerd Limbo, CNM [facility]  08/19/2023 10:55 AM Bernerd Limbo, CNM [facility]    Bernerd Limbo, CNM

## 2023-08-05 ENCOUNTER — Ambulatory Visit (INDEPENDENT_AMBULATORY_CARE_PROVIDER_SITE_OTHER): Payer: BC Managed Care – PPO | Admitting: Certified Nurse Midwife

## 2023-08-05 ENCOUNTER — Other Ambulatory Visit: Payer: Self-pay

## 2023-08-05 ENCOUNTER — Other Ambulatory Visit (HOSPITAL_COMMUNITY)
Admission: RE | Admit: 2023-08-05 | Discharge: 2023-08-05 | Disposition: A | Discharge status: Home / Self Care | Payer: BC Managed Care – PPO | Source: Ambulatory Visit | Attending: Certified Nurse Midwife | Admitting: Certified Nurse Midwife

## 2023-08-05 VITALS — BP 139/79 | HR 81 | Wt 225.4 lb

## 2023-08-05 DIAGNOSIS — Z3493 Encounter for supervision of normal pregnancy, unspecified, third trimester: Secondary | ICD-10-CM

## 2023-08-05 DIAGNOSIS — Z3A36 36 weeks gestation of pregnancy: Secondary | ICD-10-CM

## 2023-08-05 DIAGNOSIS — Z8759 Personal history of other complications of pregnancy, childbirth and the puerperium: Secondary | ICD-10-CM

## 2023-08-07 LAB — CERVICOVAGINAL ANCILLARY ONLY
Chlamydia: NEGATIVE
Comment: NEGATIVE
Comment: NORMAL
Neisseria Gonorrhea: NEGATIVE

## 2023-08-09 LAB — CULTURE, BETA STREP (GROUP B ONLY): Strep Gp B Culture: NEGATIVE

## 2023-08-12 ENCOUNTER — Ambulatory Visit: Payer: BC Managed Care – PPO | Admitting: Certified Nurse Midwife

## 2023-08-12 VITALS — BP 138/81 | HR 92 | Wt 226.4 lb

## 2023-08-12 DIAGNOSIS — Z23 Encounter for immunization: Secondary | ICD-10-CM

## 2023-08-12 DIAGNOSIS — Z3493 Encounter for supervision of normal pregnancy, unspecified, third trimester: Secondary | ICD-10-CM

## 2023-08-12 DIAGNOSIS — Z3A37 37 weeks gestation of pregnancy: Secondary | ICD-10-CM

## 2023-08-12 DIAGNOSIS — Z8759 Personal history of other complications of pregnancy, childbirth and the puerperium: Secondary | ICD-10-CM

## 2023-08-12 NOTE — Progress Notes (Signed)
   PRENATAL VISIT NOTE  Subjective:  Kayla Potts is a 32 y.o. G3P1011 at [redacted]w[redacted]d being seen today for ongoing prenatal care.  She is currently monitored for the following issues for this low-risk pregnancy and has Supervision of low-risk pregnancy; History of pre-eclampsia; History of postpartum hemorrhage; and Indication for care in labor and delivery, antepartum on their problem list.  Patient reports no complaints.  Contractions: Irregular. Vag. Bleeding: None.  Movement: Present. Denies leaking of fluid.   The following portions of the patient's history were reviewed and updated as appropriate: allergies, current medications, past family history, past medical history, past social history, past surgical history and problem list.   Objective:   Vitals:   08/12/23 1130  BP: 138/81  Pulse: 92  Weight: 226 lb 6.4 oz (102.7 kg)   Fetal Status: Fetal Heart Rate (bpm): 145 Fundal Height: 38 cm Movement: Present     General:  Alert, oriented and cooperative. Patient is in no acute distress.  Skin: Skin is warm and dry. No rash noted.   Cardiovascular: Normal heart rate noted  Respiratory: Normal respiratory effort, no problems with respiration noted  Abdomen: Soft, gravid, appropriate for gestational age.  Pain/Pressure: Present     Pelvic: Cervical exam deferred Dilation: Fingertip Effacement (%): 50 Station: -3  Extremities: Normal range of motion.  Edema: Mild pitting, slight indentation  Mental Status: Normal mood and affect. Normal behavior. Normal judgment and thought content.   Assessment and Plan:  Pregnancy: G3P1011 at [redacted]w[redacted]d 1. Encounter for supervision of low-risk pregnancy in third trimester - Doing well, feeling regular and vigorous fetal movement  - Tdap vaccine greater than or equal to 7yo IM  2. [redacted] weeks gestation of pregnancy - Routine OB care   3. History of pre-eclampsia - BP normal but borderline. Strong preeclampsia precautions given. Advised for 39wk IOL which  may not risk her out for waterbirth. - Likely to develop gHTN which will risk her out of waterbirth, long discussion on importance of getting baby delivered before she develops PEC. - Will continue to monitor BP closely, encouraged elevation of legs periodically, increased protein/hydration and decrease any sugar intake.  Term labor symptoms and general obstetric precautions including but not limited to vaginal bleeding, contractions, leaking of fluid and fetal movement were reviewed in detail with the patient. Please refer to After Visit Summary for other counseling recommendations.   Return in about 1 week (around 08/19/2023) for IN-PERSON, LOB.  Future Appointments  Date Time Provider Department Center  08/19/2023 10:55 AM Bernerd Limbo, CNM [facility]    Bernerd Limbo, CNM

## 2023-08-13 ENCOUNTER — Encounter (HOSPITAL_COMMUNITY): Payer: Self-pay | Admitting: Obstetrics and Gynecology

## 2023-08-13 ENCOUNTER — Inpatient Hospital Stay (HOSPITAL_COMMUNITY)
Admission: AD | Admit: 2023-08-13 | Discharge: 2023-08-16 | DRG: 807 | Disposition: A | Discharge status: Home / Self Care | Payer: BC Managed Care – PPO | Attending: Obstetrics and Gynecology | Admitting: Obstetrics and Gynecology

## 2023-08-13 ENCOUNTER — Other Ambulatory Visit: Payer: Self-pay

## 2023-08-13 DIAGNOSIS — Z3493 Encounter for supervision of normal pregnancy, unspecified, third trimester: Principal | ICD-10-CM

## 2023-08-13 DIAGNOSIS — Z3A38 38 weeks gestation of pregnancy: Secondary | ICD-10-CM

## 2023-08-13 DIAGNOSIS — R030 Elevated blood-pressure reading, without diagnosis of hypertension: Secondary | ICD-10-CM | POA: Diagnosis present

## 2023-08-13 DIAGNOSIS — O1414 Severe pre-eclampsia complicating childbirth: Principal | ICD-10-CM | POA: Diagnosis present

## 2023-08-13 LAB — COMPREHENSIVE METABOLIC PANEL
ALT: 24 U/L (ref 0–44)
AST: 32 U/L (ref 15–41)
Albumin: 3 g/dL — ABNORMAL LOW (ref 3.5–5.0)
Alkaline Phosphatase: 71 U/L (ref 38–126)
Anion gap: 8 (ref 5–15)
BUN: 9 mg/dL (ref 6–20)
CO2: 20 mmol/L — ABNORMAL LOW (ref 22–32)
Calcium: 9.6 mg/dL (ref 8.9–10.3)
Chloride: 106 mmol/L (ref 98–111)
Creatinine, Ser: 0.58 mg/dL (ref 0.44–1.00)
GFR, Estimated: >60 mL/min (ref >60)
Glucose, Bld: 82 mg/dL (ref 70–99)
Potassium: 4 mmol/L (ref 3.5–5.1)
Sodium: 134 mmol/L — ABNORMAL LOW (ref 135–145)
Total Bilirubin: 0.8 mg/dL (ref 0.3–1.2)
Total Protein: 6.3 g/dL — ABNORMAL LOW (ref 6.5–8.1)

## 2023-08-13 LAB — CBC
HCT: 39.9 % (ref 36.0–46.0)
Hemoglobin: 12.9 g/dL (ref 12.0–15.0)
MCH: 29.4 pg (ref 26.0–34.0)
MCHC: 32.3 g/dL (ref 30.0–36.0)
MCV: 90.9 fL (ref 80.0–100.0)
Platelets: 115 10*3/uL — ABNORMAL LOW (ref 150–400)
RBC: 4.39 MIL/uL (ref 3.87–5.11)
RDW: 14.1 % (ref 11.5–15.5)
WBC: 7.4 10*3/uL (ref 4.0–10.5)
nRBC: 0 % (ref 0.0–0.2)

## 2023-08-13 LAB — TYPE AND SCREEN
ABO/RH(D): O POS
Antibody Screen: NEGATIVE

## 2023-08-13 LAB — PROTEIN / CREATININE RATIO, URINE
Creatinine, Urine: 41 mg/dL
Protein Creatinine Ratio: 0.24 mg/mg{Cre} — ABNORMAL HIGH (ref 0.00–0.15)
Total Protein, Urine: 10 mg/dL

## 2023-08-13 MED ORDER — TERBUTALINE SULFATE 1 MG/ML IJ SOLN: 0.2500 mg | Freq: Once | INTRAMUSCULAR | Status: DC | PRN

## 2023-08-13 MED ORDER — MAGNESIUM SULFATE 40 GM/1000ML IV SOLN
2.0000 g/h | INTRAVENOUS | Status: DC
  Administered 2023-08-13: 2 g/h via INTRAVENOUS
  Filled 2023-08-13: qty 1000

## 2023-08-13 MED ORDER — ACETAMINOPHEN 325 MG PO TABS
650.0000 mg | ORAL_TABLET | ORAL | Status: DC | PRN
  Administered 2023-08-13: 650 mg via ORAL
  Filled 2023-08-13: qty 2

## 2023-08-13 MED ORDER — SOD CITRATE-CITRIC ACID 500-334 MG/5ML PO SOLN: 30.0000 mL | ORAL | Status: DC | PRN

## 2023-08-13 MED ORDER — MISOPROSTOL 50MCG HALF TABLET
50.0000 ug | ORAL_TABLET | ORAL | Status: DC | PRN
  Administered 2023-08-13: 50 ug via BUCCAL
  Filled 2023-08-13: qty 1

## 2023-08-13 MED ORDER — MAGNESIUM SULFATE BOLUS VIA INFUSION
4.0000 g | Freq: Once | INTRAVENOUS | Status: AC
  Administered 2023-08-13: 4 g via INTRAVENOUS
  Filled 2023-08-13: qty 1000

## 2023-08-13 MED ORDER — OXYTOCIN BOLUS FROM INFUSION
333.0000 mL | Freq: Once | INTRAVENOUS | Status: AC
  Administered 2023-08-14: 333 mL via INTRAVENOUS

## 2023-08-13 MED ORDER — LABETALOL HCL 200 MG PO TABS
200.0000 mg | ORAL_TABLET | Freq: Two times a day (BID) | ORAL | Status: DC
  Administered 2023-08-13 – 2023-08-14 (×2): 200 mg via ORAL
  Filled 2023-08-13 (×2): qty 1

## 2023-08-13 MED ORDER — NIFEDIPINE 10 MG PO CAPS
10.0000 mg | ORAL_CAPSULE | ORAL | Status: DC | PRN
  Administered 2023-08-13: 10 mg via ORAL
  Filled 2023-08-13: qty 1

## 2023-08-13 MED ORDER — LABETALOL HCL 5 MG/ML IV SOLN: 40.0000 mg | INTRAVENOUS | Status: DC | PRN

## 2023-08-13 MED ORDER — NIFEDIPINE 10 MG PO CAPS
20.0000 mg | ORAL_CAPSULE | ORAL | Status: DC | PRN
  Administered 2023-08-13: 20 mg via ORAL

## 2023-08-13 MED ORDER — LIDOCAINE HCL (PF) 1 % IJ SOLN: 30.0000 mL | INTRAMUSCULAR | Status: DC | PRN

## 2023-08-13 MED ORDER — ONDANSETRON HCL 4 MG/2ML IJ SOLN: 4.0000 mg | Freq: Four times a day (QID) | INTRAMUSCULAR | Status: DC | PRN

## 2023-08-13 MED ORDER — LACTATED RINGERS IV SOLN: INTRAVENOUS | Status: DC

## 2023-08-13 MED ORDER — LACTATED RINGERS IV SOLN: 500.0000 mL | INTRAVENOUS | Status: DC | PRN

## 2023-08-13 MED ORDER — FENTANYL CITRATE (PF) 100 MCG/2ML IJ SOLN: 50.0000 ug | INTRAMUSCULAR | Status: DC | PRN

## 2023-08-13 MED ORDER — NIFEDIPINE 10 MG PO CAPS
20.0000 mg | ORAL_CAPSULE | ORAL | Status: DC | PRN
  Filled 2023-08-13: qty 2

## 2023-08-13 MED ORDER — OXYTOCIN-SODIUM CHLORIDE 30-0.9 UT/500ML-% IV SOLN
2.5000 [IU]/h | INTRAVENOUS | Status: DC
  Administered 2023-08-14: 2.5 [IU]/h via INTRAVENOUS
  Filled 2023-08-13: qty 500

## 2023-08-13 NOTE — H&P (Signed)
Kayla Potts is a 32 y.o. female, G3P1011 at 25 weeks, presenting for elevated blood pressures.  Patient receives care at [facility] and was supervised for a low- risk pregnancy. Pregnancy and medical history significant for problems as listed below. She is GBS negative and is undecided regarding pain management.  She is anticipating a female infant and requests oral contraception for PP birth control method.     Patient Active Problem List   Diagnosis Date Noted   Indication for care in labor and delivery, antepartum 08/13/2023   History of postpartum hemorrhage 02/26/2023   History of pre-eclampsia 02/25/2023   Supervision of low-risk pregnancy 02/17/2023    History of present pregnancy:  Last evaluation:  08/12/2023 by Tyler Aas, CNM  BP: 138/81  Pulse: 92  Weight: 226 lb 6.4 oz (102.7 kg)          Nursing Staff Provider  Office Location [facility] Dating  08/27/2023, by Last Menstrual Period  [facility] Model Traditional      Language  English Anatomy US  Normal  Flu Vaccine  Declined 02/17/2023 Genetic/Carrier Screen  NIPS: LR female AFP: negative Horizon: Negative x4  TDaP Vaccine  08/12/23 Hgb A1C or  GTT Early - 5.4 Third trimester -   COVID Vaccine Not recieved   LAB RESULTS   Rhogam  O/Positive/-- (04/10 1153)  Blood Type O/Positive/-- (04/10 1153)   Baby Feeding Plan both Antibody Negative (04/10 1153)  Contraception oral contraceptives (estrogen/progesterone) Rubella 5.65 (04/10 1153)  Circumcision If boy, yes RPR Non Reactive (04/10 1153)   Pediatrician  [facility] HBsAg Negative (04/10 1153)   Support Person FOB HCVAb Non Reactive (04/10 1153)   Prenatal Classes   HIV Non Reactive (04/10 1153)     BTL Consent NA GBS (For PCN allergy, check sensitivities)   VBAC Consent NA Pap No results found for: "DIAGPAP"           DME Rx [ ]  BP cuff [ ]  Weight Scale Waterbirth  [ ]  Class [ ]  Consent [ ]  CNM visit  PHQ9 & GAD7 [  ] new OB [  ] 28 weeks  [  ] 36 weeks Induction   [ ]  Orders Entered [ ] Foley Y/N     OB History     Gravida  3   Para  1   Term  1   Preterm      AB  1   Living  1      SAB  1   IAB      Ectopic      Multiple  0   Live Births  1             Past Medical History:  Diagnosis Date   Cervical high risk human papillomavirus (HPV) DNA test positive 01/18/2021   Formatting of this note might be different from the original. 01/10/21 NILM, HR HPV positive (negative for 16, 18, 45) Denies hx of abnormal paps. Patient counseled on follow up plan Plan Repeat pap smear postpartum visit Formatting of this note might be different from the original. 01/10/21 NILM, HR HPV positive (negative for 16, 18, 45) Denies hx of abnormal paps. Patient counseled on follow up pl   Encounter for other specified antenatal screening 03/07/2021   Formatting of this note might be different from the original. Diagnosis necessary for prenatal screening labs. Formatting of this note might be different from the original. Diagnosis necessary for prenatal screening labs.   History of  postpartum hemorrhage 02/26/2023   at first delivery (on mag for severe PEC)   Indication for care in labor or delivery 07/11/2021   Medical history non-contributory    Pregnancy 01/21/2021   Formatting of this note might be different from the original. Dating by LMP c/w 8 week [place]. Estimated Date of Delivery: 07/26/21 NT and NIPS wnl, female (gender reveal planned, pt unaware of sex) 20wk anatomic scan- normal, fundal placenta, incomplete profile views. Repeat at 24wks. Formatting of this note might be different from the original. Dating by LMP c/w 8 week [place]. Estimated Date of Delivery:    Pregnancy of unknown anatomic location 11/02/2018   Past Surgical History:  Procedure Laterality Date   NO PAST SURGERIES     Family History: family history includes Healthy in her mother; Irregular heart beat in her father. Social History:  reports that she has never smoked. She  has never used smokeless tobacco. She reports that she does not currently use alcohol. She reports that she does not use drugs.   Prenatal Transfer Tool  Maternal Diabetes: No Genetic Screening: Normal Maternal Ultrasounds/Referrals: Normal Fetal Ultrasounds or other Referrals:  None Maternal Substance Abuse:  No Significant Maternal Medications:  None Significant Maternal Lab Results: Group B Strep negative   Maternal Assessment:  ROS: +Contractions, -LOF, -Vaginal Bleeding, +Fetal Movement  All other systems reviewed and negative.    No Known Allergies     Blood pressure (!) 159/95, pulse 99, temperature 98.3 F (36.8 C), temperature source Oral, resp. rate 18, height 5\' 5"  (1.651 m), weight 102.5 kg, last menstrual period 11/20/2022, SpO2 99%, currently breastfeeding.  Physical Exam Vitals reviewed.  Constitutional:      Appearance: Normal appearance.  HENT:     Head: Normocephalic and atraumatic.  Eyes:     Conjunctiva/sclera: Conjunctivae normal.  Cardiovascular:     Rate and Rhythm: Normal rate.  Pulmonary:     Effort: Pulmonary effort is normal. No respiratory distress.  Abdominal:     Palpations: Abdomen is soft.     Comments: Gravid--fundal height appears AGA, Soft, NT   Musculoskeletal:        General: Normal range of motion.     Cervical back: Normal range of motion.  Skin:    General: Skin is warm and dry.  Neurological:     Mental Status: She is alert and oriented to person, place, and time.  Psychiatric:        Mood and Affect: Mood normal.        Behavior: Behavior normal.     Fetal Assessment: FHR: 135 bpm, Mod Var, -Decels, +15x15 Accels UCs:  Irregular    Assessment IUP at 38 weeks Cat I FT GHTN H/O PreE H/O PPH GBS Negative   Plan: -Patient presented in MAU and informed that in setting of consistently elevated bps, h/o PreE, and GA recommendation for admission for IOL. -Labs ordered and returned with decreased plts (115), but  otherwise insignificant findings.  -Discussed criteria for magnesium sulfate and informed not currently indicated. -Reviewed treatment when severe blood pressures occur and also reassured that not currently indicated.  -Admit to [employer]  -Routine Labor and Delivery Orders per Protocol In room to complete assessment and discuss POC: -Discussed induction methods including cervical ripening agents, foley bulbs, and pitocin -Patient verbalizes understanding and is agreeable with proceeding with induction. -L&D team updated on patient status    Joellyn Quails, MSN 08/13/2023, 4:54 PM

## 2023-08-13 NOTE — Progress Notes (Signed)
Patient Vitals for the past 4 hrs:  BP Pulse  08/13/23 2210 (!) 156/80 (!) 102  08/13/23 2110 139/76 (!) 110  08/13/23 2009 (!) 155/85 (!) 106  08/13/23 1932 (!) 166/78 (!) 106  08/13/23 1925 (!) 165/94 (!) 103   Has had procardia IR 10mg  X2.  Will add PO labetalol 200mg  BID.  Ctx feel stronger, q 2-4 minutes.  FHR Cat 1. Ctx increasing in strength, q 3-4 minutes.  Foley out, cx 7/80/-2.  AROM w/clear fluid.  Will start pitocin if labor stalls.  Magnesium sulfate @ 2gm/hr.

## 2023-08-13 NOTE — MAU Note (Signed)
.  Kayla Potts is a 32 y.o. at [redacted]w[redacted]d here in MAU reporting: elevated b/p at home 145/92. Had headache last night as well . No headache today. No visual problems  or abd pain . Good fetal movement reported. LMP:  Onset of complaint: last night Pain score: 0 Vitals:   08/13/23 1451  BP: (!) 154/93  Pulse: (!) 106  Resp: 18  Temp: 98.1 F (36.7 C)     FHT:144 Lab orders placed from triage:

## 2023-08-13 NOTE — Progress Notes (Signed)
Pt comfortable.   Blood pressure (!) 166/78, pulse (!) 106, temperature 98.3 F (36.8 C), temperature source Oral, resp. rate 18, height 5\' 5"  (1.651 m), weight 102.5 kg, last menstrual period 11/20/2022, SpO2 99%, currently breastfeeding.   FHT: 120 mod var + accels, no decels Toco Q4 min CE 3/60/-3, FB placed.   Plan: Magnesium for severe PreE. Will do labs Q12 hours (5a/5p).  Has had cytotec. FB placed with 50 cc.  FHT category 1  AROM after FB out. Will add pitocin if needed.  Plan reviewed with pt and she agrees with plan.

## 2023-08-14 ENCOUNTER — Encounter (HOSPITAL_COMMUNITY): Payer: Self-pay | Admitting: Family Medicine

## 2023-08-14 ENCOUNTER — Telehealth: Payer: Self-pay | Admitting: Certified Nurse Midwife

## 2023-08-14 DIAGNOSIS — O1414 Severe pre-eclampsia complicating childbirth: Secondary | ICD-10-CM | POA: Diagnosis not present

## 2023-08-14 DIAGNOSIS — Z3A38 38 weeks gestation of pregnancy: Secondary | ICD-10-CM

## 2023-08-14 LAB — COMPREHENSIVE METABOLIC PANEL
ALT: 23 U/L (ref 0–44)
AST: 31 U/L (ref 15–41)
Albumin: 2.9 g/dL — ABNORMAL LOW (ref 3.5–5.0)
Alkaline Phosphatase: 71 U/L (ref 38–126)
Anion gap: 13 (ref 5–15)
BUN: 8 mg/dL (ref 6–20)
CO2: 21 mmol/L — ABNORMAL LOW (ref 22–32)
Calcium: 8.7 mg/dL — ABNORMAL LOW (ref 8.9–10.3)
Chloride: 100 mmol/L (ref 98–111)
Creatinine, Ser: 0.66 mg/dL (ref 0.44–1.00)
GFR, Estimated: >60 mL/min (ref >60)
Glucose, Bld: 96 mg/dL (ref 70–99)
Potassium: 3.8 mmol/L (ref 3.5–5.1)
Sodium: 134 mmol/L — ABNORMAL LOW (ref 135–145)
Total Bilirubin: 0.6 mg/dL (ref 0.3–1.2)
Total Protein: 6.2 g/dL — ABNORMAL LOW (ref 6.5–8.1)

## 2023-08-14 LAB — CBC
HCT: 36.8 % (ref 36.0–46.0)
Hemoglobin: 12.4 g/dL (ref 12.0–15.0)
MCH: 30.2 pg (ref 26.0–34.0)
MCHC: 33.7 g/dL (ref 30.0–36.0)
MCV: 89.8 fL (ref 80.0–100.0)
Platelets: 120 10*3/uL — ABNORMAL LOW (ref 150–400)
RBC: 4.1 MIL/uL (ref 3.87–5.11)
RDW: 14 % (ref 11.5–15.5)
WBC: 8.7 10*3/uL (ref 4.0–10.5)
nRBC: 0 % (ref 0.0–0.2)

## 2023-08-14 LAB — MAGNESIUM: Magnesium: 4.1 mg/dL — ABNORMAL HIGH (ref 1.7–2.4)

## 2023-08-14 LAB — RPR: RPR Ser Ql: NONREACTIVE

## 2023-08-14 MED ORDER — WITCH HAZEL-GLYCERIN EX PADS: 1.0000 | MEDICATED_PAD | CUTANEOUS | Status: DC | PRN

## 2023-08-14 MED ORDER — SIMETHICONE 80 MG PO CHEW: 80.0000 mg | CHEWABLE_TABLET | ORAL | Status: DC | PRN

## 2023-08-14 MED ORDER — SENNOSIDES-DOCUSATE SODIUM 8.6-50 MG PO TABS
2.0000 | ORAL_TABLET | Freq: Every day | ORAL | Status: DC
  Administered 2023-08-15 – 2023-08-16 (×2): 2 via ORAL
  Filled 2023-08-14 (×2): qty 2

## 2023-08-14 MED ORDER — ONDANSETRON HCL 4 MG PO TABS: 4.0000 mg | ORAL_TABLET | ORAL | Status: DC | PRN

## 2023-08-14 MED ORDER — TETANUS-DIPHTH-ACELL PERTUSSIS 5-2.5-18.5 LF-MCG/0.5 IM SUSY: 0.5000 mL | PREFILLED_SYRINGE | Freq: Once | INTRAMUSCULAR | Status: DC

## 2023-08-14 MED ORDER — OXYCODONE HCL 5 MG PO TABS: 10.0000 mg | ORAL_TABLET | ORAL | Status: DC | PRN

## 2023-08-14 MED ORDER — COCONUT OIL OIL
1.0000 | TOPICAL_OIL | Status: DC | PRN
  Administered 2023-08-14: 1 via TOPICAL

## 2023-08-14 MED ORDER — DIPHENHYDRAMINE HCL 25 MG PO CAPS: 25.0000 mg | ORAL_CAPSULE | Freq: Four times a day (QID) | ORAL | Status: DC | PRN

## 2023-08-14 MED ORDER — NIFEDIPINE ER OSMOTIC RELEASE 30 MG PO TB24
30.0000 mg | ORAL_TABLET | Freq: Every day | ORAL | Status: DC
  Administered 2023-08-15 – 2023-08-16 (×2): 30 mg via ORAL
  Filled 2023-08-14 (×2): qty 1

## 2023-08-14 MED ORDER — ONDANSETRON HCL 4 MG/2ML IJ SOLN: 4.0000 mg | INTRAMUSCULAR | Status: DC | PRN

## 2023-08-14 MED ORDER — PRENATAL MULTIVITAMIN CH
1.0000 | ORAL_TABLET | Freq: Every day | ORAL | Status: DC
  Administered 2023-08-15: 1 via ORAL
  Filled 2023-08-14: qty 1

## 2023-08-14 MED ORDER — METHYLERGONOVINE MALEATE 0.2 MG/ML IJ SOLN
INTRAMUSCULAR | Status: AC
  Filled 2023-08-14: qty 1

## 2023-08-14 MED ORDER — FUROSEMIDE 20 MG PO TABS
20.0000 mg | ORAL_TABLET | Freq: Every day | ORAL | Status: DC
  Administered 2023-08-14: 20 mg via ORAL
  Filled 2023-08-14: qty 1

## 2023-08-14 MED ORDER — FUROSEMIDE 20 MG PO TABS
20.0000 mg | ORAL_TABLET | Freq: Every day | ORAL | Status: DC
  Administered 2023-08-15 – 2023-08-16 (×2): 20 mg via ORAL
  Filled 2023-08-14 (×3): qty 1

## 2023-08-14 MED ORDER — OXYTOCIN-SODIUM CHLORIDE 30-0.9 UT/500ML-% IV SOLN
1.0000 m[IU]/min | INTRAVENOUS | Status: DC
  Administered 2023-08-14: 2 m[IU]/min via INTRAVENOUS
  Filled 2023-08-14: qty 500

## 2023-08-14 MED ORDER — TERBUTALINE SULFATE 1 MG/ML IJ SOLN: 0.2500 mg | Freq: Once | INTRAMUSCULAR | Status: DC | PRN

## 2023-08-14 MED ORDER — MAGNESIUM SULFATE 40 GM/1000ML IV SOLN
2.0000 g/h | INTRAVENOUS | Status: AC
  Administered 2023-08-14: 2 g/h via INTRAVENOUS
  Filled 2023-08-14: qty 1000

## 2023-08-14 MED ORDER — TRANEXAMIC ACID-NACL 1000-0.7 MG/100ML-% IV SOLN
1000.0000 mg | INTRAVENOUS | Status: AC
  Administered 2023-08-14: 1000 mg via INTRAVENOUS

## 2023-08-14 MED ORDER — TRANEXAMIC ACID-NACL 1000-0.7 MG/100ML-% IV SOLN
INTRAVENOUS | Status: AC
  Filled 2023-08-14: qty 100

## 2023-08-14 MED ORDER — IBUPROFEN 600 MG PO TABS
600.0000 mg | ORAL_TABLET | Freq: Four times a day (QID) | ORAL | Status: DC
  Administered 2023-08-14 – 2023-08-16 (×6): 600 mg via ORAL
  Filled 2023-08-14 (×6): qty 1

## 2023-08-14 MED ORDER — FUROSEMIDE 20 MG PO TABS: 20.0000 mg | ORAL_TABLET | Freq: Every day | ORAL | Status: DC

## 2023-08-14 MED ORDER — ACETAMINOPHEN 325 MG PO TABS
650.0000 mg | ORAL_TABLET | ORAL | Status: DC | PRN
  Filled 2023-08-14: qty 2

## 2023-08-14 MED ORDER — DIBUCAINE (PERIANAL) 1 % EX OINT: 1.0000 | TOPICAL_OINTMENT | CUTANEOUS | Status: DC | PRN

## 2023-08-14 MED ORDER — BENZOCAINE-MENTHOL 20-0.5 % EX AERO: 1.0000 | INHALATION_SPRAY | CUTANEOUS | Status: DC | PRN

## 2023-08-14 MED ORDER — MAGNESIUM SULFATE 40 GM/1000ML IV SOLN: 2.0000 g/h | INTRAVENOUS | Status: DC

## 2023-08-14 MED ORDER — OXYCODONE HCL 5 MG PO TABS: 5.0000 mg | ORAL_TABLET | ORAL | Status: DC | PRN

## 2023-08-14 NOTE — Progress Notes (Signed)
Patient Vitals for the past 4 hrs:  BP Pulse  08/14/23 0015 (!) 156/93 (!) 105  08/13/23 2340 (!) 162/81 (!) 106  08/13/23 2210 (!) 156/80 (!) 102   Ctx are mild, q 5-7 minutes.  FHR 145, moderate variability, + accels, no decles, Nose feels stuffy.  Cx no change (7/80/-2).  Will start pitocin

## 2023-08-14 NOTE — Progress Notes (Signed)
Patient Vitals for the past 4 hrs:  BP Temp Temp src Pulse  08/14/23 0515 127/66 -- -- 86  08/14/23 0400 -- 97.9 F (36.6 C) Oral --  08/14/23 0355 134/69 -- -- 91   Ctx finally getting stronger, pt having to breathe through them.  FHR Cat 1.  Ctx q 2-3 mintues.  Cx still 7/90/-2, but more anterior and head lower.  Seems as if pt is beginning transition now, but if no change in an hour or so, will place IUPC.  Magnesium sulfate @ 2gm/hr. Labs stable.

## 2023-08-14 NOTE — Telephone Encounter (Signed)
Pt called to report elevated BP 150/90 last night, with a slight headache. Headache resolved with two Tylenol, but BP only went down to 140s/80s. This morning BP is still reading as around 140/high 80s. Advised to go to MAU for serial BP and PEC labs, suggested she take her hospital bag and make arrangements for her daughter since we will recommend IOL if she is diagnosed with gHTN or PEC. Reassurance and encouragement given, she has a meeting this morning but will go this afternoon. Will message MAU providers to let them know she is coming.  Edd Arbour, CNM, MSN, IBCLC Certified Nurse Midwife, [facility]

## 2023-08-14 NOTE — Lactation Note (Signed)
This note was copied from a baby's chart. Lactation Consultation Note  Patient Name: Kayla Potts [id number] Date: 08/14/2023 Age:30 hours Reason for consult: Initial assessment;Early term 37-38.6wks  Visited P2 birth parent of early term infant for initial consult. Parent is an experienced breastfeeder and breastfed her toddler for almost 2 years. Birth parent was eating dinner and infant was swaddled asleep in bassinet. Birth parent's friend (also a Engineer, civil (consulting)) was in the room asleep. Since birth parent was eating, LC emphasized importance of maternal nutrition, hydration and rest. Birth parent noted she breastfed well with her first baby and she just needed a review of the basics. LC reviewed breastfeeding basics and latching tips. LC reviewed LC services brochure and normal newborn behavior. LC reviewed what to expect over the next 24 hours with breasts and baby behavior.  Feeding Plan:  1) Breastfeed baby skin to skin every 2-3 hours, or sooner on demand if baby cues. 2) Call RN/LC for breastfeeding assistance.   Maternal Data Has patient been taught Hand Expression?: Yes Does the patient have breastfeeding experience prior to this delivery?: Yes How long did the patient breastfeed?: 2 years  Feeding Mother's Current Feeding Choice: Breast Milk  Interventions Interventions: Breast feeding basics reviewed;Hand express;Position options;Hand pump;Education;LC Services brochure  Discharge Pump: DEBP;Manual;Hands Free;Personal  Consult Status Consult Status: Follow-up Date: 08/15/23 Follow-up type: In-patient    Antionette Char 08/14/2023, 8:46 PM

## 2023-08-14 NOTE — Discharge Summary (Signed)
Postpartum Discharge Summary  Date of Service updated***     Patient Name: Kayla Potts DOB: 1991-03-11 MRN: [id number]  Date of admission: 08/13/2023 Delivery date:08/14/2023 Delivering provider: Edd Arbour R Date of discharge: 08/14/2023  Admitting diagnosis: Indication for care in labor and delivery, antepartum [O75.9] Intrauterine pregnancy: [redacted]w[redacted]d     Secondary diagnosis:  Principal Problem:   Indication for care in labor and delivery, antepartum  Additional problems: ***    Discharge diagnosis: Term Pregnancy Delivered and Preeclampsia (severe)                                              Post partum procedures:{Postpartum procedures:23558} Augmentation: AROM, Pitocin, Cytotec, and IP Foley Complications: None  Hospital course: Induction of Labor With Vaginal Delivery   32 y.o. yo G3P1011 at [redacted]w[redacted]d was admitted to the hospital 08/13/2023 for induction of labor.  Indication for induction: Preeclampsia.  Patient had an uncomplicated labor course complicated. Membrane Rupture Time/Date: 11:39 PM,08/13/2023  Delivery Method:Vaginal, Spontaneous Operative Delivery:N/A Episiotomy: None Lacerations:  None Details of delivery can be found in separate delivery note.  Patient had a postpartum course complicated by***. Patient is discharged home 08/14/23.  Newborn Data: Birth date:08/14/2023 Birth time:11:39 AM Gender:Female Living status:Living Apgars:8 ,9  Weight:   Magnesium Sulfate received: Yes: Seizure prophylaxis BMZ received: No Rhophylac:N/A MMR:N/A T-DaP: Declined Flu: No RSV Vaccine received: No Transfusion:{Transfusion received:30440034}  Immunizations received: Immunization History  Administered Date(s) Administered   Tdap 08/12/2023    Physical exam  Vitals:   08/14/23 1024 08/14/23 1154 08/14/23 1201 08/14/23 1202  BP: (!) 141/72 (!) 142/78 (!) 153/78 (!) 143/61  Pulse: 84 92 90 99  Resp:      Temp: 98.2 F (36.8 C)     TempSrc:      SpO2:       Weight:      Height:       General: {Exam; general:21111117} Lochia: {Desc; appropriate/inappropriate:30686::"appropriate"} Uterine Fundus: {Desc; firm/soft:30687} Incision: {Exam; incision:21111123} DVT Evaluation: {Exam; dvt:2111122} Labs: Lab Results  Component Value Date   WBC 8.7 08/14/2023   HGB 12.4 08/14/2023   HCT 36.8 08/14/2023   MCV 89.8 08/14/2023   PLT 120 (L) 08/14/2023      Latest Ref Rng & Units 08/14/2023    3:07 AM  CMP  Glucose 70 - 99 mg/dL 96   BUN 6 - 20 mg/dL 8   Creatinine 4.09 - 8.11 mg/dL 9.14   Sodium 782 - 956 mmol/L 134   Potassium 3.5 - 5.1 mmol/L 3.8   Chloride 98 - 111 mmol/L 100   CO2 22 - 32 mmol/L 21   Calcium 8.9 - 10.3 mg/dL 8.7   Total Protein 6.5 - 8.1 g/dL 6.2   Total Bilirubin 0.3 - 1.2 mg/dL 0.6   Alkaline Phos 38 - 126 U/L 71   AST 15 - 41 U/L 31   ALT 0 - 44 U/L 23    Edinburgh Score:    08/14/2021    4:08 PM  Edinburgh Postnatal Depression Scale Screening Tool  I have been able to laugh and see the funny side of things. 0  I have looked forward with enjoyment to things. 0  I have blamed myself unnecessarily when things went wrong. 2  I have been anxious or worried for no good reason. 1  I have felt scared or panicky for no  good reason. 0  Things have been getting on top of me. 0  I have been so unhappy that I have had difficulty sleeping. 0  I have felt sad or miserable. 1  I have been so unhappy that I have been crying. 0  The thought of harming myself has occurred to me. 0  Edinburgh Postnatal Depression Scale Total 4   No data recorded  After visit meds:  Allergies as of 08/14/2023   No Known Allergies   Med Rec must be completed prior to using this [facility]***      Discharge home in stable condition Infant Feeding: {Baby feeding:23562} Infant Disposition:{CHL IP OB HOME WITH CZYSAY:30160} Discharge instruction: per After Visit Summary and Postpartum booklet. Activity: Advance as tolerated. Pelvic  rest for 6 weeks.  Diet: {OB FUXN:23557322}  Future Appointments: Future Appointments  Date Time Provider Department Center  08/19/2023 10:55 AM Bernerd Limbo, CNM [facility]   Follow up Visit: Appts scheduled for BP check and postpartum  Anticipated Birth Control:  OCPs   08/14/2023 Bernerd Limbo, CNM

## 2023-08-15 LAB — CBC
HCT: 33.3 % — ABNORMAL LOW (ref 36.0–46.0)
Hemoglobin: 10.9 g/dL — ABNORMAL LOW (ref 12.0–15.0)
MCH: 30.1 pg (ref 26.0–34.0)
MCHC: 32.7 g/dL (ref 30.0–36.0)
MCV: 92 fL (ref 80.0–100.0)
Platelets: 112 10*3/uL — ABNORMAL LOW (ref 150–400)
RBC: 3.62 MIL/uL — ABNORMAL LOW (ref 3.87–5.11)
RDW: 14.5 % (ref 11.5–15.5)
WBC: 11.3 10*3/uL — ABNORMAL HIGH (ref 4.0–10.5)
nRBC: 0 % (ref 0.0–0.2)

## 2023-08-15 LAB — COMPREHENSIVE METABOLIC PANEL
ALT: 25 U/L (ref 0–44)
AST: 26 U/L (ref 15–41)
Albumin: 2.5 g/dL — ABNORMAL LOW (ref 3.5–5.0)
Alkaline Phosphatase: 62 U/L (ref 38–126)
Anion gap: 8 (ref 5–15)
BUN: 11 mg/dL (ref 6–20)
CO2: 21 mmol/L — ABNORMAL LOW (ref 22–32)
Calcium: 7.5 mg/dL — ABNORMAL LOW (ref 8.9–10.3)
Chloride: 104 mmol/L (ref 98–111)
Creatinine, Ser: 0.8 mg/dL (ref 0.44–1.00)
GFR, Estimated: >60 mL/min (ref >60)
Glucose, Bld: 119 mg/dL — ABNORMAL HIGH (ref 70–99)
Potassium: 3.7 mmol/L (ref 3.5–5.1)
Sodium: 133 mmol/L — ABNORMAL LOW (ref 135–145)
Total Bilirubin: 0.4 mg/dL (ref 0.3–1.2)
Total Protein: 5.4 g/dL — ABNORMAL LOW (ref 6.5–8.1)

## 2023-08-15 NOTE — Progress Notes (Signed)
Post Partum Day 1 Pre eclampsia with SF: BP Subjective: no complaints and tolerating PO  Objective: Blood pressure (!) 117/56, pulse 88, temperature 98.2 F (36.8 C), temperature source Oral, resp. rate 17, height 5\' 5"  (1.651 m), weight 102.5 kg, last menstrual period 11/20/2022, SpO2 100%, unknown if currently breastfeeding.  Physical Exam:  General: alert, cooperative, and no distress Lochia: appropriate Uterine Fundus: firm Incision:  DVT Evaluation: No evidence of DVT seen on physical exam.  Recent Labs    08/14/23 0307 08/15/23 0511  HGB 12.4 10.9*  HCT 36.8 33.3*    Assessment/Plan: Plan for discharge tomorrow and Breastfeeding Mag comes off at 1130 Continue nifedipine 30 every day + Lasix   LOS: 2 days   Lazaro Arms, MD 08/15/2023, 7:57 AM

## 2023-08-16 ENCOUNTER — Encounter (HOSPITAL_COMMUNITY): Payer: Self-pay | Admitting: Family Medicine

## 2023-08-16 HISTORY — PX: CIRCUMCISION BABY: PRO46

## 2023-08-16 MED ORDER — NIFEDIPINE ER 30 MG PO TB24: 30.0000 mg | ORAL_TABLET | Freq: Every day | ORAL | 1 refills | Status: DC

## 2023-08-16 MED ORDER — FUROSEMIDE 20 MG PO TABS: 20.0000 mg | ORAL_TABLET | Freq: Every day | ORAL | 0 refills | Status: DC

## 2023-08-16 MED ORDER — IBUPROFEN 600 MG PO TABS: 600.0000 mg | ORAL_TABLET | Freq: Four times a day (QID) | ORAL | 0 refills | Status: DC

## 2023-08-16 NOTE — Procedures (Deleted)
Circumcision Procedure Note  Preprocedural Diagnoses: Parental desire for neonatal circumcision, normal female phallus, prophylaxis against HIV infection and other infections (ICD10 Z29.8)  Postprocedural Diagnoses:  The same. Status post routine circumcision  Procedure: Neonatal Circumcision using Gomco Clamp  Proceduralist: Navassa Bing, MD  Preprocedural Counseling: Parent desires circumcision for this female infant.  Circumcision procedure details discussed, risks and benefits of procedure were also discussed.  The benefits include but are not limited to: reduction in the rates of urinary tract infection (UTI), penile cancer, sexually transmitted infections including HIV, penile inflammatory and retractile disorders.  Circumcision also helps obtain better and easier hygiene of the penis.  Risks include but are not limited to: bleeding, infection, injury of glans which may lead to penile deformity or urinary tract issues or Urology intervention, unsatisfactory cosmetic appearance and other potential complications related to the procedure.  It was emphasized that this is an elective procedure.  Written informed consent was obtained.  Anesthesia: 1% lidocaine local, Tylenol  EBL: Minimal  Complications: None immediate  Procedure Details:  A timeout was performed and the infant's identify verified prior to starting the procedure. The infant was laid in a supine position, and an alcohol prep was done.  A dorsal penile nerve block was performed with 1% lidocaine. The area was then cleaned with betadine and draped in sterile fashion.   Two hemostats are applied at the 3 o'clock and 9 o'clock positions on the foreskin.  While maintaining traction, a third hemostat was used to sweep around the glans the release adhesions between the glans and the inner layer of mucosa avoiding the 5 o'clock and 7 o'clock positions.   The hemostat was then placed at the 12 o'clock position in the midline.  The hemostat  was then removed and scissors were used to cut along the crushed skin to its most proximal point.   The foreskin was then retracted over the glans removing any additional adhesions with blunt dissection or probe.  The foreskin was then placed back over the glans and a 1.1  Gomco bell was inserted over the glans.  The two hemostats were removed and a safety pin was placed to hold the foreskin and underlying mucosa.  The incision was guided above the base plate of the Gomco.  The clamp was attached and tightened until the foreskin is crushed between the bell and the base plate.  This was held in place for 5 minutes with excision of the foreskin atop the base plate with the scalpel.  The excised foreskin was removed and discarded per hospital protocol.  The thumbscrew was then loosened, base plate removed and then bell removed with gentle traction.  The area was inspected and found to be hemostatic.  A strip of petrolatum  gauze was then applied to the cut edge of the foreskin.   The patient tolerated procedure well.  Routine post circumcision orders were placed; patient will receive routine post circumcision and nursery care.  Harvey Bing, MD Faculty Practice, [facility] [employer]

## 2023-08-16 NOTE — Lactation Note (Signed)
This note was copied from a baby's chart. Lactation Consultation Note  Patient Name: Boy Carsyn Boster [id number] Date: 08/16/2023 Age:30 hours Reason for consult: Follow-up assessment;Maternal discharge;Early term 86-38.6wks;Infant weight loss  Visited P2 parent for discharge consult. Mother had family in the room visiting and helping her get ready for discharge. LC reviewed discharge education and answered all questions.  Maternal Data Has patient been taught Hand Expression?: Yes  Feeding Mother's Current Feeding Choice: Breast Milk  Interventions Interventions: Breast feeding basics reviewed;Education;LC Services brochure  Discharge Discharge Education: Engorgement and breast care;Warning signs for feeding baby;Outpatient recommendation  Consult Status Consult Status: Complete Date: 08/16/23    Antionette Char 08/16/2023, 11:37 AM

## 2023-08-19 ENCOUNTER — Encounter: Payer: BC Managed Care – PPO | Admitting: Certified Nurse Midwife

## 2023-08-20 ENCOUNTER — Ambulatory Visit: Payer: BC Managed Care – PPO

## 2023-09-03 ENCOUNTER — Inpatient Hospital Stay (HOSPITAL_COMMUNITY): Payer: BC Managed Care – PPO

## 2023-09-09 ENCOUNTER — Telehealth (HOSPITAL_COMMUNITY): Payer: Self-pay

## 2023-09-09 NOTE — Telephone Encounter (Signed)
09/09/2023 1025  Name: Daniah Ghormley MRN: [id number] DOB: Jun 18, 1991  Reason for Call:  Transition of Care Hospital Discharge Call  Contact Status:  Message  Language assistant needed: Interpreter Mode: Interpreter Not Needed        Follow-Up Questions:    Inocente Salles Postnatal Depression Scale:  In the Past 7 Days:    PHQ2-9 Depression Scale:     Discharge Follow-up:    Post-discharge interventions: NA  Signature  Signe Colt

## 2023-09-30 ENCOUNTER — Encounter: Payer: Self-pay | Admitting: Certified Nurse Midwife

## 2023-09-30 ENCOUNTER — Other Ambulatory Visit: Payer: Self-pay

## 2023-09-30 ENCOUNTER — Ambulatory Visit: Payer: BC Managed Care – PPO | Admitting: Certified Nurse Midwife

## 2023-09-30 DIAGNOSIS — Z8759 Personal history of other complications of pregnancy, childbirth and the puerperium: Secondary | ICD-10-CM

## 2023-09-30 DIAGNOSIS — O165 Unspecified maternal hypertension, complicating the puerperium: Secondary | ICD-10-CM

## 2023-09-30 DIAGNOSIS — Z30011 Encounter for initial prescription of contraceptive pills: Secondary | ICD-10-CM | POA: Diagnosis not present

## 2023-09-30 MED ORDER — AMLODIPINE BESYLATE 5 MG PO TABS: 5.0000 mg | ORAL_TABLET | Freq: Every day | ORAL | 2 refills | Status: AC

## 2023-09-30 MED ORDER — SLYND 4 MG PO TABS: 1.0000 | ORAL_TABLET | Freq: Every day | ORAL | 11 refills | Status: DC

## 2023-09-30 NOTE — Progress Notes (Signed)
Postpartum Visit Note  Kayla Potts is a 32 y.o. G43P2012 female who presents for a postpartum visit. She is 6 weeks postpartum following a normal spontaneous vaginal delivery.  I have fully reviewed the prenatal and intrapartum course. The delivery was at [redacted]w[redacted]d gestational weeks.  Anesthesia: none. Postpartum course has been uncomplicated. Baby is doing well. Baby is feeding by both breast and bottle - Similac . Bleeding no bleeding. Bowel function is normal. Bladder function is normal. Patient is not sexually active. Contraception method is none. Postpartum depression screening: negative.   Upstream - 09/30/23 1158       Pregnancy Intention Screening   Does the patient want to become pregnant in the next year? No    Does the patient's partner want to become pregnant in the next year? No    Would the patient like to discuss contraceptive options today? No      Contraception Wrap Up   Current Method No Contraceptive Precautions    End Method Oral Contraceptive    Contraception Counseling Provided No            The pregnancy intention screening data noted above was reviewed. Potential methods of contraception were discussed. The patient elected to proceed with Oral Contraceptive.   Edinburgh Postnatal Depression Scale - 09/30/23 1159       Edinburgh Postnatal Depression Scale:  In the Past 7 Days   I have been able to laugh and see the funny side of things. 0    I have looked forward with enjoyment to things. 0    I have blamed myself unnecessarily when things went wrong. 2    I have been anxious or worried for no good reason. 0    I have felt scared or panicky for no good reason. 0    Things have been getting on top of me. 0    I have been so unhappy that I have had difficulty sleeping. 0    I have felt sad or miserable. 0    I have been so unhappy that I have been crying. 0    The thought of harming myself has occurred to me. 0    Edinburgh Postnatal Depression Scale Total  2             Health Maintenance Due  Topic Date Due   INFLUENZA VACCINE  Never done   COVID-19 Vaccine (1 - 2023-24 season) Never done    The following portions of the patient's history were reviewed and updated as appropriate: allergies, current medications, past family history, past medical history, past social history, past surgical history, and problem list.  Review of Systems Pertinent items noted in HPI and remainder of comprehensive ROS otherwise negative.  Objective:  BP (!) 139/92   Pulse (!) 56   Wt 200 lb (90.7 kg)   LMP 11/20/2022   Breastfeeding Yes   BMI 33.28 kg/m   Constitutional: Alert, oriented female in no physical distress.  HEENT: PERRLA Skin: normal color and turgor, no rash Cardiovascular: normal rate & rhythm Respiratory: normal effort, no problems with respiration noted GI: Abd soft, non-tender MS: Extremities nontender, no edema, normal ROM Neurologic: Alert and oriented x 4.  GU: no CVA tenderness Pelvic: exam deferred, will reschedule for pap Breasts: normal lactating breasts, no edema or signs of nipple damage  Assessment:  1. Postpartum care and examination - Recovering well, had a very good birth experience  2. Postpartum hypertension - BP still elevated today, will  switch to norvasc and take until return visit - amLODipine (NORVASC) 5 MG tablet; Take 1 tablet (5 mg total) by mouth daily.  Dispense: 30 tablet; Refill: 2  3. Encounter for oral contraception initial prescription - Drospirenone (SLYND) 4 MG TABS; Take 1 tablet (4 mg total) by mouth daily.  Dispense: 28 tablet; Refill: 11   Plan:   Essential components of care per ACOG recommendations:  1.  Mood and well being: Patient with negative depression screening today. Reviewed local resources for support.  - Patient tobacco use? No.   - hx of drug use? No.    2. Infant care and feeding:  -Patient currently breastmilk feeding? Yes. Reviewed importance of draining breast  regularly to support lactation.  -Social determinants of health (SDOH) reviewed in EPIC. No concerns  3. Sexuality, contraception and birth spacing - Patient does not want a pregnancy in the next year.  Desired family size is 2 children.  - Reviewed reproductive life planning. Reviewed contraceptive methods based on pt preferences and effectiveness.  Patient desired Oral Contraceptive today.   - Discussed birth spacing of 18 months  4. Sleep and fatigue -Encouraged family/partner/community support of 4 hrs of uninterrupted sleep to help with mood and fatigue  5. Physical Recovery  - Discussed patients delivery and complications. She describes her labor as good. - Patient had a Vaginal, no problems at delivery. Patient had  no  laceration. Perineal healing reviewed. Patient expressed understanding - Patient has urinary incontinence? No. - Patient is safe to resume physical and sexual activity  6.  Health Maintenance - HM due items addressed Yes - Last pap smear 2022, HRHPV+, normal pathology. Pap smear not done at today's visit.  -Breast Cancer screening indicated? No.   7. Chronic Disease/Pregnancy Condition follow up: Hypertension and abnormal pap, will reschedule in one month - PCP follow up as needed  Bernerd Limbo, CNM [facility] [employer]

## 2023-10-27 DIAGNOSIS — O165 Unspecified maternal hypertension, complicating the puerperium: Secondary | ICD-10-CM | POA: Insufficient documentation

## 2023-10-27 NOTE — Progress Notes (Signed)
ANNUAL EXAM Patient name: [name] MRN [id number]  Date of birth: 1991-06-22 Chief Complaint:   Gynecologic Exam  History of Present Illness:   Kayla Potts is a 32 y.o. G98P2012 African-American female being seen today for a routine annual exam.  Current complaints: None  No LMP recorded. (Breastfeeding)  Last pap 01/16/2021. Results were: NILM w/ HRHPV positive: other (not 16, 18/45). H/O abnormal pap: yes Last mammogram: None (age). Results were: N/A. Family h/o breast cancer: no Last colonoscopy: None (age). Results were: N/A. Family h/o colorectal cancer: no     06/17/2023    5:35 PM 07/04/2021    3:02 PM 05/08/2021   10:12 AM  Depression screen PHQ 2/9  Decreased Interest 1 1 0  Down, Depressed, Hopeless 1 0 0  PHQ - 2 Score 2 1 0  Altered sleeping 2 1 1   Tired, decreased energy 1 0 0  Change in appetite 0 0 0  Feeling bad or failure about yourself  1 0 0  Trouble concentrating 0 0 0  Moving slowly or fidgety/restless 0 0 0  Suicidal thoughts 0 0 0  PHQ-9 Score 6 2 1   Difficult doing work/chores  Not difficult at all Not difficult at all        06/17/2023    5:35 PM 07/04/2021    3:02 PM 05/08/2021   10:12 AM  GAD 7 : Generalized Anxiety Score  Nervous, Anxious, on Edge 1 0 0  Control/stop worrying 0 0 0  Worry too much - different things 1 0 0  Trouble relaxing 1 2 1   Restless 0 0 0  Easily annoyed or irritable 1 1 0  Afraid - awful might happen 0 0 0  Total GAD 7 Score 4 3 1   Anxiety Difficulty  Not difficult at all Not difficult at all     Review of Systems:   Pertinent items are noted in HPI Denies any headaches, blurred vision, fatigue, shortness of breath, chest pain, abdominal pain, abnormal vaginal discharge/itching/odor/irritation, problems with periods, bowel movements, urination, or intercourse unless otherwise stated above. Pertinent History Reviewed:  Reviewed past medical,surgical, social and family history.  Reviewed problem list,  medications and allergies. Physical Assessment:   Vitals:   10/28/23 1612  BP: 124/74  Pulse: 94  Weight: 204 lb 14.4 oz (92.9 kg)   Body mass index is 34.1 kg/m.   Physical Examination:  General appearance - well appearing, and in no distress Mental status - alert, oriented to person, place, and time Psych:  She has a normal mood and affect Skin - warm and dry, normal color, no suspicious lesions noted Chest - effort normal, all lung fields clear to auscultation bilaterally Heart - normal rate and regular rhythm Neck:  midline trachea, no thyromegaly or nodules Breasts - breasts appear normal Abdomen - soft, nontender, nondistended, no masses or organomegaly Pelvic - VULVA: normal appearing vulva with no masses, tenderness or lesions  VAGINA: normal appearing vagina with normal color and discharge, no lesions  CERVIX: normal appearing cervix without discharge or lesions, no CMT Thin prep pap is done with HR HPV cotesting Extremities:  No swelling or varicosities noted  Chaperone present for exam  No results found for this or any previous visit (from Kayla past 24 hours).  Assessment & Plan:  1. Encounter for annual routine gynecological examination (Primary) - Routine preventative health maintenance measures emphasized.  2. Postpartum hypertension - Completed course of procardia, BP normal otday  3. Pap smear for  cervical cancer screening - Cytology - PAP( [facility])   Mammogram: @ 30yo, or sooner if problems Colonoscopy: @ 30yo, or sooner if problems  No orders of Kayla defined types were placed in this encounter.   Meds: No orders of Kayla defined types were placed in this encounter.   Follow-up: Return if symptoms worsen or fail to improve.  Bernerd Limbo, CNM 10/30/2023 10:22 PM

## 2023-10-28 ENCOUNTER — Encounter: Payer: Self-pay | Admitting: Certified Nurse Midwife

## 2023-10-28 ENCOUNTER — Ambulatory Visit (INDEPENDENT_AMBULATORY_CARE_PROVIDER_SITE_OTHER): Payer: BC Managed Care – PPO | Admitting: Certified Nurse Midwife

## 2023-10-28 ENCOUNTER — Other Ambulatory Visit (HOSPITAL_COMMUNITY)
Admission: RE | Admit: 2023-10-28 | Discharge: 2023-10-28 | Disposition: A | Discharge status: Home / Self Care | Payer: BC Managed Care – PPO | Source: Ambulatory Visit | Attending: Certified Nurse Midwife | Admitting: Certified Nurse Midwife

## 2023-10-28 ENCOUNTER — Other Ambulatory Visit: Payer: Self-pay

## 2023-10-28 VITALS — BP 124/74 | HR 94 | Wt 204.9 lb

## 2023-10-28 DIAGNOSIS — Z124 Encounter for screening for malignant neoplasm of cervix: Secondary | ICD-10-CM

## 2023-10-28 DIAGNOSIS — O165 Unspecified maternal hypertension, complicating the puerperium: Secondary | ICD-10-CM

## 2023-10-28 DIAGNOSIS — Z01419 Encounter for gynecological examination (general) (routine) without abnormal findings: Secondary | ICD-10-CM

## 2023-10-30 LAB — CYTOLOGY - PAP
Comment: NEGATIVE
Diagnosis: NEGATIVE
High risk HPV: NEGATIVE

## 2023-11-03 ENCOUNTER — Telehealth: Payer: Self-pay | Admitting: Lactation Services

## 2023-11-03 NOTE — Telephone Encounter (Signed)
Patient called and LM on Lactation line that she was trying to reach Edd Arbour for Rx refill and would like a call back. Will route to Clinical pool.

## 2023-11-04 NOTE — Telephone Encounter (Signed)
Returned call to patient. She reports that her insurance will not cover her Slynd and is now on hold. Patient reports she was able to pick up the first time she had it filled.   Called and spoke with Pharmacy and they report that her insurance will not longer cover. It is $232. It was charged under Medicaid previously.   Patient gave mer her old address to check on PA. Will follow up through covermymeds.

## 2023-11-05 ENCOUNTER — Encounter: Payer: Self-pay | Admitting: Lactation Services

## 2023-11-26 ENCOUNTER — Encounter: Payer: Self-pay | Admitting: Certified Nurse Midwife

## 2023-11-26 DIAGNOSIS — Z30011 Encounter for initial prescription of contraceptive pills: Secondary | ICD-10-CM

## 2023-12-07 MED ORDER — SLYND 4 MG PO TABS: 1.0000 | ORAL_TABLET | Freq: Every day | ORAL | 11 refills | Status: AC

## 2023-12-10 ENCOUNTER — Telehealth: Payer: Self-pay | Admitting: *Deleted

## 2023-12-10 NOTE — Telephone Encounter (Signed)
[facility]. States having a drug coverage issue for RX by Edd Arbour, CNM and would like a call back to discuss. States to use reference number: [id number]. Nancy Fetter

## 2023-12-15 NOTE — Telephone Encounter (Signed)
Returned call. Pharmacy requests alternative. Explained there is not an appropriate alternative per provider. Will need to complete PA. Call transferred to PA department.  PA approved Case ID [id number]  Approval dates 11/15/23 - 12/14/24

## 2024-02-02 ENCOUNTER — Other Ambulatory Visit: Payer: Self-pay | Admitting: Lactation Services

## 2024-02-02 DIAGNOSIS — Z3A10 10 weeks gestation of pregnancy: Secondary | ICD-10-CM

## 2024-02-02 MED ORDER — PRENATAL 27-1 MG PO TABS: 1.0000 | ORAL_TABLET | Freq: Every day | ORAL | 3 refills | Status: DC

## 2024-06-08 ENCOUNTER — Other Ambulatory Visit: Payer: Self-pay | Admitting: Certified Nurse Midwife

## 2024-06-08 DIAGNOSIS — Z3A10 10 weeks gestation of pregnancy: Secondary | ICD-10-CM

## 2024-07-11 ENCOUNTER — Other Ambulatory Visit: Payer: Self-pay | Admitting: Certified Nurse Midwife

## 2024-07-11 DIAGNOSIS — Z3A10 10 weeks gestation of pregnancy: Secondary | ICD-10-CM

## 2024-08-12 ENCOUNTER — Other Ambulatory Visit: Payer: Self-pay | Admitting: Certified Nurse Midwife

## 2024-08-12 DIAGNOSIS — Z3A10 10 weeks gestation of pregnancy: Secondary | ICD-10-CM
